# Patient Record
Sex: Male | Born: 1945 | Race: White | Hispanic: No | Marital: Married | Smoking: Never smoker
Health system: Southern US, Community
[De-identification: ages and names within clinical notes are randomized; demographics above are authoritative.]

## PROBLEM LIST (undated history)

## (undated) DIAGNOSIS — M1990 Unspecified osteoarthritis, unspecified site: Secondary | ICD-10-CM

## (undated) DIAGNOSIS — I4892 Unspecified atrial flutter: Secondary | ICD-10-CM

## (undated) DIAGNOSIS — R51 Headache: Secondary | ICD-10-CM

## (undated) DIAGNOSIS — T7840XA Allergy, unspecified, initial encounter: Secondary | ICD-10-CM

## (undated) DIAGNOSIS — Z45018 Encounter for adjustment and management of other part of cardiac pacemaker: Secondary | ICD-10-CM

## (undated) DIAGNOSIS — S065X9A Traumatic subdural hemorrhage with loss of consciousness of unspecified duration, initial encounter: Secondary | ICD-10-CM

## (undated) DIAGNOSIS — F419 Anxiety disorder, unspecified: Secondary | ICD-10-CM

## (undated) DIAGNOSIS — S3210XA Unspecified fracture of sacrum, initial encounter for closed fracture: Secondary | ICD-10-CM

## (undated) DIAGNOSIS — F411 Generalized anxiety disorder: Secondary | ICD-10-CM

## (undated) DIAGNOSIS — Z79899 Other long term (current) drug therapy: Secondary | ICD-10-CM

## (undated) DIAGNOSIS — R569 Unspecified convulsions: Secondary | ICD-10-CM

## (undated) DIAGNOSIS — R251 Tremor, unspecified: Secondary | ICD-10-CM

## (undated) DIAGNOSIS — S069X9A Unspecified intracranial injury with loss of consciousness of unspecified duration, initial encounter: Secondary | ICD-10-CM

## (undated) DIAGNOSIS — E785 Hyperlipidemia, unspecified: Secondary | ICD-10-CM

## (undated) DIAGNOSIS — H409 Unspecified glaucoma: Secondary | ICD-10-CM

## (undated) DIAGNOSIS — F329 Major depressive disorder, single episode, unspecified: Secondary | ICD-10-CM

## (undated) DIAGNOSIS — R4189 Other symptoms and signs involving cognitive functions and awareness: Secondary | ICD-10-CM

## (undated) DIAGNOSIS — S066X0A Traumatic subarachnoid hemorrhage without loss of consciousness, initial encounter: Secondary | ICD-10-CM

## (undated) DIAGNOSIS — I609 Nontraumatic subarachnoid hemorrhage, unspecified: Secondary | ICD-10-CM

## (undated) DIAGNOSIS — F339 Major depressive disorder, recurrent, unspecified: Secondary | ICD-10-CM

## (undated) DIAGNOSIS — H9190 Unspecified hearing loss, unspecified ear: Secondary | ICD-10-CM

## (undated) DIAGNOSIS — I480 Paroxysmal atrial fibrillation: Secondary | ICD-10-CM

## (undated) DIAGNOSIS — I495 Sick sinus syndrome: Secondary | ICD-10-CM

## (undated) DIAGNOSIS — F32A Depression, unspecified: Secondary | ICD-10-CM

## (undated) DIAGNOSIS — G40209 Localization-related (focal) (partial) symptomatic epilepsy and epileptic syndromes with complex partial seizures, not intractable, without status epilepticus: Secondary | ICD-10-CM

## (undated) DIAGNOSIS — Z950 Presence of cardiac pacemaker: Secondary | ICD-10-CM

## (undated) DIAGNOSIS — R2689 Other abnormalities of gait and mobility: Secondary | ICD-10-CM

## (undated) DIAGNOSIS — R001 Bradycardia, unspecified: Secondary | ICD-10-CM

## (undated) DIAGNOSIS — J45909 Unspecified asthma, uncomplicated: Secondary | ICD-10-CM

## (undated) DIAGNOSIS — S0291XA Unspecified fracture of skull, initial encounter for closed fracture: Secondary | ICD-10-CM

## (undated) DIAGNOSIS — R29898 Other symptoms and signs involving the musculoskeletal system: Secondary | ICD-10-CM

## (undated) DIAGNOSIS — T148XXA Other injury of unspecified body region, initial encounter: Secondary | ICD-10-CM

## (undated) HISTORY — DX: Unspecified osteoarthritis, unspecified site: M19.90

## (undated) HISTORY — DX: Unspecified asthma, uncomplicated: J45.909

## (undated) HISTORY — DX: Major depressive disorder, recurrent, unspecified: F33.9

## (undated) HISTORY — DX: Traumatic subdural hemorrhage with loss of consciousness of unspecified duration, initial encounter: S06.5X9A

## (undated) HISTORY — DX: Headache: R51

## (undated) HISTORY — DX: Allergy, unspecified, initial encounter: T78.40XA

## (undated) HISTORY — DX: Unspecified hearing loss, unspecified ear: H91.90

## (undated) HISTORY — DX: Traumatic subarachnoid hemorrhage without loss of consciousness, initial encounter: S06.6X0A

## (undated) HISTORY — DX: Anxiety disorder, unspecified: F41.9

## (undated) HISTORY — DX: Sick sinus syndrome: I49.5

## (undated) HISTORY — DX: Generalized anxiety disorder: F41.1

## (undated) HISTORY — DX: Localization-related (focal) (partial) symptomatic epilepsy and epileptic syndromes with complex partial seizures, not intractable, without status epilepticus: G40.209

## (undated) HISTORY — DX: Unspecified intracranial injury with loss of consciousness of unspecified duration, initial encounter: S06.9X9A

## (undated) HISTORY — DX: Other symptoms and signs involving cognitive functions and awareness: R41.89

## (undated) HISTORY — DX: Unspecified fracture of skull, initial encounter for closed fracture: S02.91XA

## (undated) HISTORY — DX: Nontraumatic subarachnoid hemorrhage, unspecified: I60.9

## (undated) HISTORY — DX: Other abnormalities of gait and mobility: R26.89

## (undated) HISTORY — DX: Other long term (current) drug therapy: Z79.899

## (undated) HISTORY — DX: Paroxysmal atrial fibrillation: I48.0

## (undated) HISTORY — DX: Tremor, unspecified: R25.1

## (undated) HISTORY — DX: Unspecified convulsions: R56.9

## (undated) HISTORY — DX: Other symptoms and signs involving the musculoskeletal system: R29.898

## (undated) HISTORY — DX: Other injury of unspecified body region, initial encounter: T14.8XXA

## (undated) HISTORY — DX: Unspecified glaucoma: H40.9

## (undated) HISTORY — DX: Presence of cardiac pacemaker: Z95.0

## (undated) HISTORY — DX: Bradycardia, unspecified: R00.1

## (undated) HISTORY — DX: Unspecified fracture of sacrum, initial encounter for closed fracture: S32.10XA

## (undated) HISTORY — DX: Unspecified atrial flutter: I48.92

## (undated) HISTORY — DX: Hyperlipidemia, unspecified: E78.5

## (undated) HISTORY — DX: Depression, unspecified: F32.A

## (undated) HISTORY — DX: Major depressive disorder, single episode, unspecified: F32.9

## (undated) HISTORY — DX: Encounter for adjustment and management of other part of cardiac pacemaker: Z45.018

---

## 1968-09-01 HISTORY — PX: LUMBAR LAMINECTOMY: SHX95

## 1996-09-01 HISTORY — PX: INGUINAL HERNIA REPAIR: SUR1180

## 2003-05-11 ENCOUNTER — Encounter: Admission: RE | Admit: 2003-05-11 | Discharge: 2003-05-11 | Payer: Self-pay | Admitting: Internal Medicine

## 2004-08-08 ENCOUNTER — Ambulatory Visit: Payer: Self-pay | Admitting: Internal Medicine

## 2004-08-20 ENCOUNTER — Ambulatory Visit: Payer: Self-pay | Admitting: Internal Medicine

## 2008-12-18 ENCOUNTER — Ambulatory Visit: Payer: Self-pay | Admitting: Internal Medicine

## 2009-01-11 ENCOUNTER — Ambulatory Visit: Payer: Self-pay | Admitting: Internal Medicine

## 2011-10-15 DIAGNOSIS — H2510 Age-related nuclear cataract, unspecified eye: Secondary | ICD-10-CM | POA: Diagnosis not present

## 2011-12-01 DIAGNOSIS — Z0000 Encounter for general adult medical examination without abnormal findings: Secondary | ICD-10-CM | POA: Diagnosis not present

## 2011-12-25 DIAGNOSIS — G479 Sleep disorder, unspecified: Secondary | ICD-10-CM | POA: Diagnosis not present

## 2011-12-25 DIAGNOSIS — M25569 Pain in unspecified knee: Secondary | ICD-10-CM | POA: Diagnosis not present

## 2011-12-29 DIAGNOSIS — E782 Mixed hyperlipidemia: Secondary | ICD-10-CM | POA: Diagnosis not present

## 2011-12-29 DIAGNOSIS — J3089 Other allergic rhinitis: Secondary | ICD-10-CM | POA: Diagnosis not present

## 2011-12-29 DIAGNOSIS — M159 Polyosteoarthritis, unspecified: Secondary | ICD-10-CM | POA: Diagnosis not present

## 2012-01-08 DIAGNOSIS — M999 Biomechanical lesion, unspecified: Secondary | ICD-10-CM | POA: Diagnosis not present

## 2012-01-12 DIAGNOSIS — M999 Biomechanical lesion, unspecified: Secondary | ICD-10-CM | POA: Diagnosis not present

## 2012-01-13 DIAGNOSIS — M999 Biomechanical lesion, unspecified: Secondary | ICD-10-CM | POA: Diagnosis not present

## 2012-01-14 DIAGNOSIS — M999 Biomechanical lesion, unspecified: Secondary | ICD-10-CM | POA: Diagnosis not present

## 2012-01-15 DIAGNOSIS — M999 Biomechanical lesion, unspecified: Secondary | ICD-10-CM | POA: Diagnosis not present

## 2012-01-19 DIAGNOSIS — M999 Biomechanical lesion, unspecified: Secondary | ICD-10-CM | POA: Diagnosis not present

## 2012-01-21 DIAGNOSIS — M999 Biomechanical lesion, unspecified: Secondary | ICD-10-CM | POA: Diagnosis not present

## 2012-03-01 DIAGNOSIS — H40019 Open angle with borderline findings, low risk, unspecified eye: Secondary | ICD-10-CM | POA: Diagnosis not present

## 2012-05-28 DIAGNOSIS — Z23 Encounter for immunization: Secondary | ICD-10-CM | POA: Diagnosis not present

## 2012-06-08 DIAGNOSIS — M999 Biomechanical lesion, unspecified: Secondary | ICD-10-CM | POA: Diagnosis not present

## 2012-06-09 DIAGNOSIS — M999 Biomechanical lesion, unspecified: Secondary | ICD-10-CM | POA: Diagnosis not present

## 2012-06-10 DIAGNOSIS — M999 Biomechanical lesion, unspecified: Secondary | ICD-10-CM | POA: Diagnosis not present

## 2012-06-28 DIAGNOSIS — M1710 Unilateral primary osteoarthritis, unspecified knee: Secondary | ICD-10-CM | POA: Diagnosis not present

## 2012-06-28 DIAGNOSIS — J3089 Other allergic rhinitis: Secondary | ICD-10-CM | POA: Diagnosis not present

## 2012-06-28 DIAGNOSIS — E782 Mixed hyperlipidemia: Secondary | ICD-10-CM | POA: Diagnosis not present

## 2012-08-10 DIAGNOSIS — H4011X0 Primary open-angle glaucoma, stage unspecified: Secondary | ICD-10-CM | POA: Diagnosis not present

## 2012-09-30 DIAGNOSIS — M999 Biomechanical lesion, unspecified: Secondary | ICD-10-CM | POA: Diagnosis not present

## 2012-10-04 DIAGNOSIS — M999 Biomechanical lesion, unspecified: Secondary | ICD-10-CM | POA: Diagnosis not present

## 2012-10-05 DIAGNOSIS — M999 Biomechanical lesion, unspecified: Secondary | ICD-10-CM | POA: Diagnosis not present

## 2012-10-06 DIAGNOSIS — M999 Biomechanical lesion, unspecified: Secondary | ICD-10-CM | POA: Diagnosis not present

## 2012-10-07 DIAGNOSIS — M999 Biomechanical lesion, unspecified: Secondary | ICD-10-CM | POA: Diagnosis not present

## 2012-10-11 DIAGNOSIS — M999 Biomechanical lesion, unspecified: Secondary | ICD-10-CM | POA: Diagnosis not present

## 2012-10-18 DIAGNOSIS — M999 Biomechanical lesion, unspecified: Secondary | ICD-10-CM | POA: Diagnosis not present

## 2012-11-06 DIAGNOSIS — IMO0002 Reserved for concepts with insufficient information to code with codable children: Secondary | ICD-10-CM | POA: Diagnosis not present

## 2012-11-06 DIAGNOSIS — S065XAA Traumatic subdural hemorrhage with loss of consciousness status unknown, initial encounter: Secondary | ICD-10-CM | POA: Diagnosis not present

## 2012-11-06 DIAGNOSIS — W010XXA Fall on same level from slipping, tripping and stumbling without subsequent striking against object, initial encounter: Secondary | ICD-10-CM | POA: Diagnosis not present

## 2012-11-06 DIAGNOSIS — R42 Dizziness and giddiness: Secondary | ICD-10-CM | POA: Diagnosis not present

## 2012-11-06 DIAGNOSIS — T07XXXA Unspecified multiple injuries, initial encounter: Secondary | ICD-10-CM | POA: Diagnosis not present

## 2012-11-06 DIAGNOSIS — S02109A Fracture of base of skull, unspecified side, initial encounter for closed fracture: Secondary | ICD-10-CM | POA: Diagnosis not present

## 2012-11-06 DIAGNOSIS — I4892 Unspecified atrial flutter: Secondary | ICD-10-CM | POA: Diagnosis not present

## 2012-11-06 DIAGNOSIS — I609 Nontraumatic subarachnoid hemorrhage, unspecified: Secondary | ICD-10-CM

## 2012-11-06 DIAGNOSIS — S065X9A Traumatic subdural hemorrhage with loss of consciousness of unspecified duration, initial encounter: Secondary | ICD-10-CM

## 2012-11-06 DIAGNOSIS — S0280XA Fracture of other specified skull and facial bones, unspecified side, initial encounter for closed fracture: Secondary | ICD-10-CM | POA: Diagnosis not present

## 2012-11-06 DIAGNOSIS — S066X0A Traumatic subarachnoid hemorrhage without loss of consciousness, initial encounter: Secondary | ICD-10-CM | POA: Diagnosis not present

## 2012-11-06 DIAGNOSIS — S79919A Unspecified injury of unspecified hip, initial encounter: Secondary | ICD-10-CM | POA: Diagnosis not present

## 2012-11-06 DIAGNOSIS — S3210XA Unspecified fracture of sacrum, initial encounter for closed fracture: Secondary | ICD-10-CM

## 2012-11-06 DIAGNOSIS — S066XAA Traumatic subarachnoid hemorrhage with loss of consciousness status unknown, initial encounter: Secondary | ICD-10-CM | POA: Diagnosis not present

## 2012-11-06 DIAGNOSIS — T148XXA Other injury of unspecified body region, initial encounter: Secondary | ICD-10-CM

## 2012-11-06 HISTORY — DX: Traumatic subdural hemorrhage with loss of consciousness of unspecified duration, initial encounter: S06.5X9A

## 2012-11-06 HISTORY — DX: Unspecified fracture of sacrum, initial encounter for closed fracture: S32.10XA

## 2012-11-06 HISTORY — DX: Other injury of unspecified body region, initial encounter: T14.8XXA

## 2012-11-06 HISTORY — DX: Traumatic subdural hemorrhage with loss of consciousness status unknown, initial encounter: S06.5XAA

## 2012-11-06 HISTORY — DX: Nontraumatic subarachnoid hemorrhage, unspecified: I60.9

## 2012-11-13 DIAGNOSIS — R51 Headache: Secondary | ICD-10-CM

## 2012-11-13 DIAGNOSIS — R4189 Other symptoms and signs involving cognitive functions and awareness: Secondary | ICD-10-CM

## 2012-11-13 DIAGNOSIS — R519 Headache, unspecified: Secondary | ICD-10-CM

## 2012-11-13 HISTORY — DX: Other symptoms and signs involving cognitive functions and awareness: R41.89

## 2012-11-13 HISTORY — DX: Headache, unspecified: R51.9

## 2012-11-17 DIAGNOSIS — S065XAA Traumatic subdural hemorrhage with loss of consciousness status unknown, initial encounter: Secondary | ICD-10-CM

## 2012-11-17 DIAGNOSIS — S065X9A Traumatic subdural hemorrhage with loss of consciousness of unspecified duration, initial encounter: Secondary | ICD-10-CM

## 2012-11-17 DIAGNOSIS — H906 Mixed conductive and sensorineural hearing loss, bilateral: Secondary | ICD-10-CM | POA: Diagnosis not present

## 2012-11-17 DIAGNOSIS — S3210XA Unspecified fracture of sacrum, initial encounter for closed fracture: Secondary | ICD-10-CM | POA: Diagnosis not present

## 2012-11-17 DIAGNOSIS — S322XXA Fracture of coccyx, initial encounter for closed fracture: Secondary | ICD-10-CM | POA: Diagnosis not present

## 2012-11-17 HISTORY — DX: Traumatic subdural hemorrhage with loss of consciousness of unspecified duration, initial encounter: S06.5X9A

## 2012-11-17 HISTORY — DX: Traumatic subdural hemorrhage with loss of consciousness status unknown, initial encounter: S06.5XAA

## 2012-12-14 DIAGNOSIS — Z5189 Encounter for other specified aftercare: Secondary | ICD-10-CM | POA: Diagnosis not present

## 2012-12-14 DIAGNOSIS — M6281 Muscle weakness (generalized): Secondary | ICD-10-CM | POA: Diagnosis not present

## 2012-12-14 DIAGNOSIS — I6200 Nontraumatic subdural hemorrhage, unspecified: Secondary | ICD-10-CM | POA: Diagnosis not present

## 2012-12-14 DIAGNOSIS — R41841 Cognitive communication deficit: Secondary | ICD-10-CM | POA: Diagnosis not present

## 2012-12-14 DIAGNOSIS — R269 Unspecified abnormalities of gait and mobility: Secondary | ICD-10-CM | POA: Diagnosis not present

## 2012-12-14 DIAGNOSIS — G44309 Post-traumatic headache, unspecified, not intractable: Secondary | ICD-10-CM | POA: Diagnosis not present

## 2012-12-15 DIAGNOSIS — G44309 Post-traumatic headache, unspecified, not intractable: Secondary | ICD-10-CM | POA: Diagnosis not present

## 2012-12-15 DIAGNOSIS — R269 Unspecified abnormalities of gait and mobility: Secondary | ICD-10-CM | POA: Diagnosis not present

## 2012-12-15 DIAGNOSIS — M6281 Muscle weakness (generalized): Secondary | ICD-10-CM | POA: Diagnosis not present

## 2012-12-15 DIAGNOSIS — Z5189 Encounter for other specified aftercare: Secondary | ICD-10-CM | POA: Diagnosis not present

## 2012-12-15 DIAGNOSIS — R41841 Cognitive communication deficit: Secondary | ICD-10-CM | POA: Diagnosis not present

## 2012-12-15 DIAGNOSIS — I6200 Nontraumatic subdural hemorrhage, unspecified: Secondary | ICD-10-CM | POA: Diagnosis not present

## 2012-12-20 DIAGNOSIS — N390 Urinary tract infection, site not specified: Secondary | ICD-10-CM | POA: Diagnosis not present

## 2012-12-20 DIAGNOSIS — I6200 Nontraumatic subdural hemorrhage, unspecified: Secondary | ICD-10-CM | POA: Diagnosis not present

## 2012-12-20 DIAGNOSIS — I10 Essential (primary) hypertension: Secondary | ICD-10-CM | POA: Diagnosis not present

## 2012-12-21 DIAGNOSIS — Z5189 Encounter for other specified aftercare: Secondary | ICD-10-CM | POA: Diagnosis not present

## 2012-12-21 DIAGNOSIS — R269 Unspecified abnormalities of gait and mobility: Secondary | ICD-10-CM | POA: Diagnosis not present

## 2012-12-21 DIAGNOSIS — I6200 Nontraumatic subdural hemorrhage, unspecified: Secondary | ICD-10-CM | POA: Diagnosis not present

## 2012-12-21 DIAGNOSIS — M6281 Muscle weakness (generalized): Secondary | ICD-10-CM | POA: Diagnosis not present

## 2012-12-21 DIAGNOSIS — G44309 Post-traumatic headache, unspecified, not intractable: Secondary | ICD-10-CM | POA: Diagnosis not present

## 2012-12-21 DIAGNOSIS — R41841 Cognitive communication deficit: Secondary | ICD-10-CM | POA: Diagnosis not present

## 2012-12-23 DIAGNOSIS — G44309 Post-traumatic headache, unspecified, not intractable: Secondary | ICD-10-CM | POA: Diagnosis not present

## 2012-12-23 DIAGNOSIS — Z5189 Encounter for other specified aftercare: Secondary | ICD-10-CM | POA: Diagnosis not present

## 2012-12-23 DIAGNOSIS — R269 Unspecified abnormalities of gait and mobility: Secondary | ICD-10-CM | POA: Diagnosis not present

## 2012-12-23 DIAGNOSIS — I6200 Nontraumatic subdural hemorrhage, unspecified: Secondary | ICD-10-CM | POA: Diagnosis not present

## 2012-12-23 DIAGNOSIS — M6281 Muscle weakness (generalized): Secondary | ICD-10-CM | POA: Diagnosis not present

## 2012-12-23 DIAGNOSIS — R41841 Cognitive communication deficit: Secondary | ICD-10-CM | POA: Diagnosis not present

## 2012-12-28 DIAGNOSIS — Z5189 Encounter for other specified aftercare: Secondary | ICD-10-CM | POA: Diagnosis not present

## 2012-12-28 DIAGNOSIS — R269 Unspecified abnormalities of gait and mobility: Secondary | ICD-10-CM | POA: Diagnosis not present

## 2012-12-28 DIAGNOSIS — M6281 Muscle weakness (generalized): Secondary | ICD-10-CM | POA: Diagnosis not present

## 2012-12-28 DIAGNOSIS — I6200 Nontraumatic subdural hemorrhage, unspecified: Secondary | ICD-10-CM | POA: Diagnosis not present

## 2012-12-28 DIAGNOSIS — R41841 Cognitive communication deficit: Secondary | ICD-10-CM | POA: Diagnosis not present

## 2012-12-28 DIAGNOSIS — G44309 Post-traumatic headache, unspecified, not intractable: Secondary | ICD-10-CM | POA: Diagnosis not present

## 2012-12-30 DIAGNOSIS — R41841 Cognitive communication deficit: Secondary | ICD-10-CM | POA: Diagnosis not present

## 2012-12-30 DIAGNOSIS — M6281 Muscle weakness (generalized): Secondary | ICD-10-CM | POA: Diagnosis not present

## 2012-12-30 DIAGNOSIS — Z5189 Encounter for other specified aftercare: Secondary | ICD-10-CM | POA: Diagnosis not present

## 2012-12-30 DIAGNOSIS — I6200 Nontraumatic subdural hemorrhage, unspecified: Secondary | ICD-10-CM | POA: Diagnosis not present

## 2012-12-30 DIAGNOSIS — R269 Unspecified abnormalities of gait and mobility: Secondary | ICD-10-CM | POA: Diagnosis not present

## 2012-12-30 DIAGNOSIS — G44309 Post-traumatic headache, unspecified, not intractable: Secondary | ICD-10-CM | POA: Diagnosis not present

## 2013-01-03 DIAGNOSIS — G44309 Post-traumatic headache, unspecified, not intractable: Secondary | ICD-10-CM | POA: Diagnosis not present

## 2013-01-03 DIAGNOSIS — I6200 Nontraumatic subdural hemorrhage, unspecified: Secondary | ICD-10-CM | POA: Diagnosis not present

## 2013-01-03 DIAGNOSIS — M6281 Muscle weakness (generalized): Secondary | ICD-10-CM | POA: Diagnosis not present

## 2013-01-03 DIAGNOSIS — Z5189 Encounter for other specified aftercare: Secondary | ICD-10-CM | POA: Diagnosis not present

## 2013-01-03 DIAGNOSIS — R41841 Cognitive communication deficit: Secondary | ICD-10-CM | POA: Diagnosis not present

## 2013-01-03 DIAGNOSIS — R269 Unspecified abnormalities of gait and mobility: Secondary | ICD-10-CM | POA: Diagnosis not present

## 2013-01-04 DIAGNOSIS — I499 Cardiac arrhythmia, unspecified: Secondary | ICD-10-CM | POA: Diagnosis not present

## 2013-01-04 DIAGNOSIS — I4892 Unspecified atrial flutter: Secondary | ICD-10-CM | POA: Diagnosis not present

## 2013-01-04 DIAGNOSIS — I491 Atrial premature depolarization: Secondary | ICD-10-CM | POA: Diagnosis not present

## 2013-01-06 DIAGNOSIS — G44309 Post-traumatic headache, unspecified, not intractable: Secondary | ICD-10-CM | POA: Diagnosis not present

## 2013-01-06 DIAGNOSIS — R269 Unspecified abnormalities of gait and mobility: Secondary | ICD-10-CM | POA: Diagnosis not present

## 2013-01-06 DIAGNOSIS — Z5189 Encounter for other specified aftercare: Secondary | ICD-10-CM | POA: Diagnosis not present

## 2013-01-06 DIAGNOSIS — M6281 Muscle weakness (generalized): Secondary | ICD-10-CM | POA: Diagnosis not present

## 2013-01-06 DIAGNOSIS — I6200 Nontraumatic subdural hemorrhage, unspecified: Secondary | ICD-10-CM | POA: Diagnosis not present

## 2013-01-06 DIAGNOSIS — R41841 Cognitive communication deficit: Secondary | ICD-10-CM | POA: Diagnosis not present

## 2013-01-11 DIAGNOSIS — R269 Unspecified abnormalities of gait and mobility: Secondary | ICD-10-CM | POA: Diagnosis not present

## 2013-01-11 DIAGNOSIS — G44309 Post-traumatic headache, unspecified, not intractable: Secondary | ICD-10-CM | POA: Diagnosis not present

## 2013-01-11 DIAGNOSIS — I6200 Nontraumatic subdural hemorrhage, unspecified: Secondary | ICD-10-CM | POA: Diagnosis not present

## 2013-01-11 DIAGNOSIS — M6281 Muscle weakness (generalized): Secondary | ICD-10-CM | POA: Diagnosis not present

## 2013-01-11 DIAGNOSIS — Z5189 Encounter for other specified aftercare: Secondary | ICD-10-CM | POA: Diagnosis not present

## 2013-01-11 DIAGNOSIS — R41841 Cognitive communication deficit: Secondary | ICD-10-CM | POA: Diagnosis not present

## 2013-01-13 DIAGNOSIS — R41841 Cognitive communication deficit: Secondary | ICD-10-CM | POA: Diagnosis not present

## 2013-01-13 DIAGNOSIS — Z5189 Encounter for other specified aftercare: Secondary | ICD-10-CM | POA: Diagnosis not present

## 2013-01-13 DIAGNOSIS — I6200 Nontraumatic subdural hemorrhage, unspecified: Secondary | ICD-10-CM | POA: Diagnosis not present

## 2013-01-13 DIAGNOSIS — G44309 Post-traumatic headache, unspecified, not intractable: Secondary | ICD-10-CM | POA: Diagnosis not present

## 2013-01-13 DIAGNOSIS — R269 Unspecified abnormalities of gait and mobility: Secondary | ICD-10-CM | POA: Diagnosis not present

## 2013-01-13 DIAGNOSIS — M6281 Muscle weakness (generalized): Secondary | ICD-10-CM | POA: Diagnosis not present

## 2013-01-17 DIAGNOSIS — H903 Sensorineural hearing loss, bilateral: Secondary | ICD-10-CM | POA: Diagnosis not present

## 2013-01-17 DIAGNOSIS — H9190 Unspecified hearing loss, unspecified ear: Secondary | ICD-10-CM | POA: Diagnosis not present

## 2013-01-17 DIAGNOSIS — H9319 Tinnitus, unspecified ear: Secondary | ICD-10-CM | POA: Diagnosis not present

## 2013-01-18 DIAGNOSIS — I6200 Nontraumatic subdural hemorrhage, unspecified: Secondary | ICD-10-CM | POA: Diagnosis not present

## 2013-01-18 DIAGNOSIS — R41841 Cognitive communication deficit: Secondary | ICD-10-CM | POA: Diagnosis not present

## 2013-01-18 DIAGNOSIS — Z5189 Encounter for other specified aftercare: Secondary | ICD-10-CM | POA: Diagnosis not present

## 2013-01-18 DIAGNOSIS — R269 Unspecified abnormalities of gait and mobility: Secondary | ICD-10-CM | POA: Diagnosis not present

## 2013-01-18 DIAGNOSIS — G44309 Post-traumatic headache, unspecified, not intractable: Secondary | ICD-10-CM | POA: Diagnosis not present

## 2013-01-18 DIAGNOSIS — M6281 Muscle weakness (generalized): Secondary | ICD-10-CM | POA: Diagnosis not present

## 2013-01-20 DIAGNOSIS — R41841 Cognitive communication deficit: Secondary | ICD-10-CM | POA: Diagnosis not present

## 2013-01-20 DIAGNOSIS — Z5189 Encounter for other specified aftercare: Secondary | ICD-10-CM | POA: Diagnosis not present

## 2013-01-20 DIAGNOSIS — M6281 Muscle weakness (generalized): Secondary | ICD-10-CM | POA: Diagnosis not present

## 2013-01-20 DIAGNOSIS — I6200 Nontraumatic subdural hemorrhage, unspecified: Secondary | ICD-10-CM | POA: Diagnosis not present

## 2013-01-20 DIAGNOSIS — R269 Unspecified abnormalities of gait and mobility: Secondary | ICD-10-CM | POA: Diagnosis not present

## 2013-01-20 DIAGNOSIS — G44309 Post-traumatic headache, unspecified, not intractable: Secondary | ICD-10-CM | POA: Diagnosis not present

## 2013-01-22 DIAGNOSIS — H9190 Unspecified hearing loss, unspecified ear: Secondary | ICD-10-CM | POA: Diagnosis not present

## 2013-02-01 DIAGNOSIS — H4011X0 Primary open-angle glaucoma, stage unspecified: Secondary | ICD-10-CM | POA: Diagnosis not present

## 2013-02-28 DIAGNOSIS — S066X0A Traumatic subarachnoid hemorrhage without loss of consciousness, initial encounter: Secondary | ICD-10-CM

## 2013-02-28 DIAGNOSIS — S0291XA Unspecified fracture of skull, initial encounter for closed fracture: Secondary | ICD-10-CM

## 2013-02-28 HISTORY — DX: Unspecified fracture of skull, initial encounter for closed fracture: S02.91XA

## 2013-02-28 HISTORY — DX: Traumatic subarachnoid hemorrhage without loss of consciousness, initial encounter: S06.6X0A

## 2013-03-01 DIAGNOSIS — S02109A Fracture of base of skull, unspecified side, initial encounter for closed fracture: Secondary | ICD-10-CM | POA: Diagnosis not present

## 2013-03-01 DIAGNOSIS — I6200 Nontraumatic subdural hemorrhage, unspecified: Secondary | ICD-10-CM | POA: Diagnosis not present

## 2013-03-01 DIAGNOSIS — IMO0001 Reserved for inherently not codable concepts without codable children: Secondary | ICD-10-CM | POA: Diagnosis not present

## 2013-03-02 DIAGNOSIS — Z1331 Encounter for screening for depression: Secondary | ICD-10-CM | POA: Diagnosis not present

## 2013-03-02 DIAGNOSIS — R0602 Shortness of breath: Secondary | ICD-10-CM | POA: Diagnosis not present

## 2013-03-02 DIAGNOSIS — F329 Major depressive disorder, single episode, unspecified: Secondary | ICD-10-CM | POA: Diagnosis not present

## 2013-04-05 DIAGNOSIS — I10 Essential (primary) hypertension: Secondary | ICD-10-CM | POA: Diagnosis not present

## 2013-04-05 DIAGNOSIS — E782 Mixed hyperlipidemia: Secondary | ICD-10-CM | POA: Diagnosis not present

## 2013-04-05 DIAGNOSIS — F329 Major depressive disorder, single episode, unspecified: Secondary | ICD-10-CM | POA: Diagnosis not present

## 2013-04-05 DIAGNOSIS — R0602 Shortness of breath: Secondary | ICD-10-CM | POA: Diagnosis not present

## 2013-04-18 DIAGNOSIS — R0602 Shortness of breath: Secondary | ICD-10-CM | POA: Diagnosis not present

## 2013-04-27 DIAGNOSIS — IMO0002 Reserved for concepts with insufficient information to code with codable children: Secondary | ICD-10-CM | POA: Diagnosis not present

## 2013-04-27 DIAGNOSIS — R0602 Shortness of breath: Secondary | ICD-10-CM | POA: Diagnosis not present

## 2013-04-27 DIAGNOSIS — Z1389 Encounter for screening for other disorder: Secondary | ICD-10-CM | POA: Diagnosis not present

## 2013-04-27 DIAGNOSIS — I4892 Unspecified atrial flutter: Secondary | ICD-10-CM | POA: Diagnosis not present

## 2013-05-04 DIAGNOSIS — R0602 Shortness of breath: Secondary | ICD-10-CM | POA: Diagnosis not present

## 2013-05-04 DIAGNOSIS — I4891 Unspecified atrial fibrillation: Secondary | ICD-10-CM | POA: Diagnosis not present

## 2013-05-04 DIAGNOSIS — E785 Hyperlipidemia, unspecified: Secondary | ICD-10-CM | POA: Diagnosis not present

## 2013-05-04 DIAGNOSIS — R259 Unspecified abnormal involuntary movements: Secondary | ICD-10-CM | POA: Diagnosis not present

## 2013-05-04 DIAGNOSIS — I4892 Unspecified atrial flutter: Secondary | ICD-10-CM | POA: Diagnosis not present

## 2013-05-04 DIAGNOSIS — Z8679 Personal history of other diseases of the circulatory system: Secondary | ICD-10-CM | POA: Diagnosis not present

## 2013-05-04 DIAGNOSIS — Z8249 Family history of ischemic heart disease and other diseases of the circulatory system: Secondary | ICD-10-CM | POA: Diagnosis not present

## 2013-05-04 DIAGNOSIS — Z789 Other specified health status: Secondary | ICD-10-CM | POA: Diagnosis not present

## 2013-05-09 DIAGNOSIS — I4891 Unspecified atrial fibrillation: Secondary | ICD-10-CM | POA: Diagnosis not present

## 2013-05-09 DIAGNOSIS — R0602 Shortness of breath: Secondary | ICD-10-CM | POA: Diagnosis not present

## 2013-05-11 DIAGNOSIS — I4892 Unspecified atrial flutter: Secondary | ICD-10-CM | POA: Diagnosis not present

## 2013-05-11 DIAGNOSIS — R0602 Shortness of breath: Secondary | ICD-10-CM | POA: Diagnosis not present

## 2013-05-11 DIAGNOSIS — Z23 Encounter for immunization: Secondary | ICD-10-CM | POA: Diagnosis not present

## 2013-05-14 DIAGNOSIS — I4891 Unspecified atrial fibrillation: Secondary | ICD-10-CM | POA: Diagnosis not present

## 2013-05-27 DIAGNOSIS — I4892 Unspecified atrial flutter: Secondary | ICD-10-CM | POA: Diagnosis not present

## 2013-06-01 DIAGNOSIS — Z9181 History of falling: Secondary | ICD-10-CM | POA: Diagnosis not present

## 2013-06-01 DIAGNOSIS — R29818 Other symptoms and signs involving the nervous system: Secondary | ICD-10-CM | POA: Diagnosis not present

## 2013-06-01 DIAGNOSIS — G9389 Other specified disorders of brain: Secondary | ICD-10-CM | POA: Diagnosis not present

## 2013-06-01 DIAGNOSIS — R2689 Other abnormalities of gait and mobility: Secondary | ICD-10-CM

## 2013-06-01 DIAGNOSIS — Z87820 Personal history of traumatic brain injury: Secondary | ICD-10-CM | POA: Diagnosis not present

## 2013-06-01 DIAGNOSIS — S0990XA Unspecified injury of head, initial encounter: Secondary | ICD-10-CM | POA: Diagnosis not present

## 2013-06-01 DIAGNOSIS — IMO0001 Reserved for inherently not codable concepts without codable children: Secondary | ICD-10-CM | POA: Diagnosis not present

## 2013-06-01 DIAGNOSIS — R413 Other amnesia: Secondary | ICD-10-CM | POA: Diagnosis not present

## 2013-06-01 DIAGNOSIS — R29898 Other symptoms and signs involving the musculoskeletal system: Secondary | ICD-10-CM

## 2013-06-01 HISTORY — DX: Other symptoms and signs involving the musculoskeletal system: R29.898

## 2013-06-01 HISTORY — DX: Other abnormalities of gait and mobility: R26.89

## 2013-06-28 DIAGNOSIS — I4891 Unspecified atrial fibrillation: Secondary | ICD-10-CM | POA: Diagnosis not present

## 2013-06-28 DIAGNOSIS — I4892 Unspecified atrial flutter: Secondary | ICD-10-CM | POA: Diagnosis not present

## 2013-06-28 DIAGNOSIS — Z79899 Other long term (current) drug therapy: Secondary | ICD-10-CM | POA: Diagnosis not present

## 2013-06-29 DIAGNOSIS — S069X0A Unspecified intracranial injury without loss of consciousness, initial encounter: Secondary | ICD-10-CM | POA: Diagnosis not present

## 2013-06-29 DIAGNOSIS — F329 Major depressive disorder, single episode, unspecified: Secondary | ICD-10-CM | POA: Diagnosis not present

## 2013-06-29 DIAGNOSIS — Z87820 Personal history of traumatic brain injury: Secondary | ICD-10-CM | POA: Diagnosis not present

## 2013-07-06 DIAGNOSIS — G3184 Mild cognitive impairment, so stated: Secondary | ICD-10-CM | POA: Diagnosis not present

## 2013-07-06 DIAGNOSIS — R29898 Other symptoms and signs involving the musculoskeletal system: Secondary | ICD-10-CM | POA: Diagnosis not present

## 2013-07-06 DIAGNOSIS — M4802 Spinal stenosis, cervical region: Secondary | ICD-10-CM | POA: Diagnosis not present

## 2013-07-06 DIAGNOSIS — M5030 Other cervical disc degeneration, unspecified cervical region: Secondary | ICD-10-CM | POA: Diagnosis not present

## 2013-07-06 DIAGNOSIS — Z87820 Personal history of traumatic brain injury: Secondary | ICD-10-CM | POA: Diagnosis not present

## 2013-07-06 DIAGNOSIS — R29818 Other symptoms and signs involving the nervous system: Secondary | ICD-10-CM | POA: Diagnosis not present

## 2013-07-06 DIAGNOSIS — M48061 Spinal stenosis, lumbar region without neurogenic claudication: Secondary | ICD-10-CM | POA: Diagnosis not present

## 2013-07-25 DIAGNOSIS — M999 Biomechanical lesion, unspecified: Secondary | ICD-10-CM | POA: Diagnosis not present

## 2013-07-26 DIAGNOSIS — M999 Biomechanical lesion, unspecified: Secondary | ICD-10-CM | POA: Diagnosis not present

## 2013-07-27 DIAGNOSIS — M999 Biomechanical lesion, unspecified: Secondary | ICD-10-CM | POA: Diagnosis not present

## 2013-08-01 DIAGNOSIS — M999 Biomechanical lesion, unspecified: Secondary | ICD-10-CM | POA: Diagnosis not present

## 2013-08-02 DIAGNOSIS — H4011X0 Primary open-angle glaucoma, stage unspecified: Secondary | ICD-10-CM | POA: Diagnosis not present

## 2013-08-02 DIAGNOSIS — H2510 Age-related nuclear cataract, unspecified eye: Secondary | ICD-10-CM | POA: Diagnosis not present

## 2013-08-05 DIAGNOSIS — E782 Mixed hyperlipidemia: Secondary | ICD-10-CM | POA: Diagnosis not present

## 2013-08-05 DIAGNOSIS — Z125 Encounter for screening for malignant neoplasm of prostate: Secondary | ICD-10-CM | POA: Diagnosis not present

## 2013-08-05 DIAGNOSIS — M4120 Other idiopathic scoliosis, site unspecified: Secondary | ICD-10-CM | POA: Diagnosis not present

## 2013-08-05 DIAGNOSIS — G8929 Other chronic pain: Secondary | ICD-10-CM | POA: Diagnosis not present

## 2013-08-05 DIAGNOSIS — Z23 Encounter for immunization: Secondary | ICD-10-CM | POA: Diagnosis not present

## 2013-08-05 DIAGNOSIS — M47817 Spondylosis without myelopathy or radiculopathy, lumbosacral region: Secondary | ICD-10-CM | POA: Diagnosis not present

## 2013-08-05 DIAGNOSIS — I10 Essential (primary) hypertension: Secondary | ICD-10-CM | POA: Diagnosis not present

## 2013-08-05 DIAGNOSIS — M545 Low back pain: Secondary | ICD-10-CM | POA: Diagnosis not present

## 2013-08-08 DIAGNOSIS — M545 Low back pain: Secondary | ICD-10-CM | POA: Diagnosis not present

## 2013-08-10 DIAGNOSIS — M545 Low back pain: Secondary | ICD-10-CM | POA: Diagnosis not present

## 2013-08-11 DIAGNOSIS — M545 Low back pain: Secondary | ICD-10-CM | POA: Diagnosis not present

## 2013-08-15 DIAGNOSIS — M545 Low back pain: Secondary | ICD-10-CM | POA: Diagnosis not present

## 2013-08-17 DIAGNOSIS — M545 Low back pain: Secondary | ICD-10-CM | POA: Diagnosis not present

## 2013-08-19 DIAGNOSIS — M545 Low back pain: Secondary | ICD-10-CM | POA: Diagnosis not present

## 2013-08-22 DIAGNOSIS — M545 Low back pain: Secondary | ICD-10-CM | POA: Diagnosis not present

## 2013-08-23 DIAGNOSIS — N401 Enlarged prostate with lower urinary tract symptoms: Secondary | ICD-10-CM | POA: Diagnosis not present

## 2013-08-23 DIAGNOSIS — M545 Low back pain: Secondary | ICD-10-CM | POA: Diagnosis not present

## 2013-08-23 DIAGNOSIS — R972 Elevated prostate specific antigen [PSA]: Secondary | ICD-10-CM | POA: Diagnosis not present

## 2013-08-31 DIAGNOSIS — M545 Low back pain: Secondary | ICD-10-CM | POA: Diagnosis not present

## 2013-09-02 DIAGNOSIS — M545 Low back pain, unspecified: Secondary | ICD-10-CM | POA: Diagnosis not present

## 2013-09-05 DIAGNOSIS — M545 Low back pain, unspecified: Secondary | ICD-10-CM | POA: Diagnosis not present

## 2013-09-07 DIAGNOSIS — M545 Low back pain, unspecified: Secondary | ICD-10-CM | POA: Diagnosis not present

## 2013-09-07 DIAGNOSIS — N401 Enlarged prostate with lower urinary tract symptoms: Secondary | ICD-10-CM | POA: Diagnosis not present

## 2013-09-07 DIAGNOSIS — R972 Elevated prostate specific antigen [PSA]: Secondary | ICD-10-CM | POA: Diagnosis not present

## 2013-09-07 DIAGNOSIS — N138 Other obstructive and reflux uropathy: Secondary | ICD-10-CM | POA: Diagnosis not present

## 2013-09-08 DIAGNOSIS — M545 Low back pain, unspecified: Secondary | ICD-10-CM | POA: Diagnosis not present

## 2013-09-09 DIAGNOSIS — Z9889 Other specified postprocedural states: Secondary | ICD-10-CM | POA: Diagnosis not present

## 2013-09-09 DIAGNOSIS — Z7982 Long term (current) use of aspirin: Secondary | ICD-10-CM | POA: Diagnosis not present

## 2013-09-09 DIAGNOSIS — S0291XS Unspecified fracture of skull, sequela: Secondary | ICD-10-CM | POA: Diagnosis not present

## 2013-09-09 DIAGNOSIS — Z8719 Personal history of other diseases of the digestive system: Secondary | ICD-10-CM | POA: Diagnosis not present

## 2013-09-09 DIAGNOSIS — Z862 Personal history of diseases of the blood and blood-forming organs and certain disorders involving the immune mechanism: Secondary | ICD-10-CM | POA: Diagnosis not present

## 2013-09-09 DIAGNOSIS — S0292XS Unspecified fracture of facial bones, sequela: Secondary | ICD-10-CM | POA: Diagnosis not present

## 2013-09-09 DIAGNOSIS — M545 Low back pain, unspecified: Secondary | ICD-10-CM | POA: Diagnosis not present

## 2013-09-09 DIAGNOSIS — Z87820 Personal history of traumatic brain injury: Secondary | ICD-10-CM | POA: Diagnosis not present

## 2013-09-09 DIAGNOSIS — Z8709 Personal history of other diseases of the respiratory system: Secondary | ICD-10-CM | POA: Diagnosis not present

## 2013-09-12 DIAGNOSIS — M545 Low back pain, unspecified: Secondary | ICD-10-CM | POA: Diagnosis not present

## 2013-09-14 DIAGNOSIS — M545 Low back pain, unspecified: Secondary | ICD-10-CM | POA: Diagnosis not present

## 2013-09-16 DIAGNOSIS — M545 Low back pain, unspecified: Secondary | ICD-10-CM | POA: Diagnosis not present

## 2013-09-19 DIAGNOSIS — M545 Low back pain, unspecified: Secondary | ICD-10-CM | POA: Diagnosis not present

## 2013-09-23 DIAGNOSIS — M545 Low back pain, unspecified: Secondary | ICD-10-CM | POA: Diagnosis not present

## 2013-09-26 DIAGNOSIS — M545 Low back pain, unspecified: Secondary | ICD-10-CM | POA: Diagnosis not present

## 2013-09-30 DIAGNOSIS — M545 Low back pain, unspecified: Secondary | ICD-10-CM | POA: Diagnosis not present

## 2013-10-05 DIAGNOSIS — S069X9A Unspecified intracranial injury with loss of consciousness of unspecified duration, initial encounter: Secondary | ICD-10-CM

## 2013-10-05 DIAGNOSIS — S069XAA Unspecified intracranial injury with loss of consciousness status unknown, initial encounter: Secondary | ICD-10-CM | POA: Insufficient documentation

## 2013-10-05 DIAGNOSIS — F339 Major depressive disorder, recurrent, unspecified: Secondary | ICD-10-CM

## 2013-10-05 DIAGNOSIS — S069X0A Unspecified intracranial injury without loss of consciousness, initial encounter: Secondary | ICD-10-CM | POA: Diagnosis not present

## 2013-10-05 HISTORY — DX: Unspecified intracranial injury with loss of consciousness of unspecified duration, initial encounter: S06.9X9A

## 2013-10-05 HISTORY — DX: Major depressive disorder, recurrent, unspecified: F33.9

## 2013-10-05 HISTORY — DX: Unspecified intracranial injury with loss of consciousness status unknown, initial encounter: S06.9XAA

## 2013-10-07 DIAGNOSIS — M545 Low back pain, unspecified: Secondary | ICD-10-CM | POA: Diagnosis not present

## 2013-10-10 DIAGNOSIS — Z79899 Other long term (current) drug therapy: Secondary | ICD-10-CM | POA: Diagnosis not present

## 2013-10-10 DIAGNOSIS — I4892 Unspecified atrial flutter: Secondary | ICD-10-CM | POA: Diagnosis not present

## 2013-10-10 DIAGNOSIS — R918 Other nonspecific abnormal finding of lung field: Secondary | ICD-10-CM | POA: Diagnosis not present

## 2013-10-10 DIAGNOSIS — Z8679 Personal history of other diseases of the circulatory system: Secondary | ICD-10-CM | POA: Diagnosis not present

## 2013-10-10 DIAGNOSIS — E785 Hyperlipidemia, unspecified: Secondary | ICD-10-CM | POA: Diagnosis not present

## 2013-10-14 DIAGNOSIS — M545 Low back pain, unspecified: Secondary | ICD-10-CM | POA: Diagnosis not present

## 2013-11-02 DIAGNOSIS — S069X0A Unspecified intracranial injury without loss of consciousness, initial encounter: Secondary | ICD-10-CM | POA: Diagnosis not present

## 2013-12-07 DIAGNOSIS — F331 Major depressive disorder, recurrent, moderate: Secondary | ICD-10-CM

## 2013-12-07 DIAGNOSIS — F411 Generalized anxiety disorder: Secondary | ICD-10-CM

## 2013-12-08 ENCOUNTER — Encounter: Payer: Self-pay | Admitting: Internal Medicine

## 2013-12-29 ENCOUNTER — Ambulatory Visit: Payer: Self-pay | Admitting: Physical Therapy

## 2013-12-29 DIAGNOSIS — F3289 Other specified depressive episodes: Secondary | ICD-10-CM | POA: Diagnosis not present

## 2013-12-29 DIAGNOSIS — F411 Generalized anxiety disorder: Secondary | ICD-10-CM | POA: Diagnosis not present

## 2013-12-29 DIAGNOSIS — F329 Major depressive disorder, single episode, unspecified: Secondary | ICD-10-CM | POA: Diagnosis not present

## 2014-01-04 ENCOUNTER — Encounter: Payer: Self-pay | Admitting: Internal Medicine

## 2014-01-04 DIAGNOSIS — E782 Mixed hyperlipidemia: Secondary | ICD-10-CM | POA: Diagnosis not present

## 2014-01-04 DIAGNOSIS — M549 Dorsalgia, unspecified: Secondary | ICD-10-CM | POA: Diagnosis not present

## 2014-01-04 DIAGNOSIS — G8929 Other chronic pain: Secondary | ICD-10-CM | POA: Diagnosis not present

## 2014-01-04 DIAGNOSIS — I10 Essential (primary) hypertension: Secondary | ICD-10-CM | POA: Diagnosis not present

## 2014-01-05 DIAGNOSIS — N138 Other obstructive and reflux uropathy: Secondary | ICD-10-CM | POA: Diagnosis not present

## 2014-01-05 DIAGNOSIS — R972 Elevated prostate specific antigen [PSA]: Secondary | ICD-10-CM | POA: Diagnosis not present

## 2014-01-05 DIAGNOSIS — N401 Enlarged prostate with lower urinary tract symptoms: Secondary | ICD-10-CM | POA: Diagnosis not present

## 2014-01-11 DIAGNOSIS — F411 Generalized anxiety disorder: Secondary | ICD-10-CM | POA: Diagnosis not present

## 2014-01-11 DIAGNOSIS — F330 Major depressive disorder, recurrent, mild: Secondary | ICD-10-CM | POA: Diagnosis not present

## 2014-01-12 DIAGNOSIS — F411 Generalized anxiety disorder: Secondary | ICD-10-CM

## 2014-01-12 HISTORY — DX: Generalized anxiety disorder: F41.1

## 2014-01-13 DIAGNOSIS — F329 Major depressive disorder, single episode, unspecified: Secondary | ICD-10-CM | POA: Diagnosis not present

## 2014-01-13 DIAGNOSIS — F3289 Other specified depressive episodes: Secondary | ICD-10-CM | POA: Diagnosis not present

## 2014-01-13 DIAGNOSIS — F411 Generalized anxiety disorder: Secondary | ICD-10-CM

## 2014-02-02 DIAGNOSIS — H4011X0 Primary open-angle glaucoma, stage unspecified: Secondary | ICD-10-CM | POA: Diagnosis not present

## 2014-03-01 DIAGNOSIS — Z79899 Other long term (current) drug therapy: Secondary | ICD-10-CM | POA: Diagnosis not present

## 2014-03-01 DIAGNOSIS — I4892 Unspecified atrial flutter: Secondary | ICD-10-CM | POA: Diagnosis not present

## 2014-03-01 DIAGNOSIS — I4891 Unspecified atrial fibrillation: Secondary | ICD-10-CM | POA: Diagnosis not present

## 2014-03-13 DIAGNOSIS — I4892 Unspecified atrial flutter: Secondary | ICD-10-CM | POA: Diagnosis not present

## 2014-03-13 DIAGNOSIS — I4891 Unspecified atrial fibrillation: Secondary | ICD-10-CM | POA: Diagnosis not present

## 2014-03-15 ENCOUNTER — Ambulatory Visit (AMBULATORY_SURGERY_CENTER): Payer: Self-pay | Admitting: *Deleted

## 2014-03-15 VITALS — Ht 75.0 in | Wt 195.8 lb

## 2014-03-15 DIAGNOSIS — Z86010 Personal history of colonic polyps: Secondary | ICD-10-CM

## 2014-03-15 MED ORDER — MOVIPREP 100 G PO SOLR: ORAL | Status: DC

## 2014-03-15 NOTE — Progress Notes (Signed)
No allergies to eggs or soy. No problems with anesthesia.  Pt given Emmi instructions for colonoscopy  No oxygen use  No diet drug use  

## 2014-03-17 DIAGNOSIS — I4891 Unspecified atrial fibrillation: Secondary | ICD-10-CM | POA: Diagnosis not present

## 2014-03-29 ENCOUNTER — Ambulatory Visit (AMBULATORY_SURGERY_CENTER): Payer: Medicare Other | Admitting: Internal Medicine

## 2014-03-29 ENCOUNTER — Encounter: Payer: Self-pay | Admitting: Internal Medicine

## 2014-03-29 VITALS — BP 130/62 | HR 52 | Temp 96.6°F | Resp 19 | Ht 75.0 in | Wt 195.0 lb

## 2014-03-29 DIAGNOSIS — Z86010 Personal history of colonic polyps: Secondary | ICD-10-CM

## 2014-03-29 DIAGNOSIS — J45909 Unspecified asthma, uncomplicated: Secondary | ICD-10-CM | POA: Diagnosis not present

## 2014-03-29 DIAGNOSIS — Z800 Family history of malignant neoplasm of digestive organs: Secondary | ICD-10-CM

## 2014-03-29 DIAGNOSIS — Z1211 Encounter for screening for malignant neoplasm of colon: Secondary | ICD-10-CM | POA: Diagnosis not present

## 2014-03-29 MED ORDER — SODIUM CHLORIDE 0.9 % IV SOLN: 500.0000 mL | INTRAVENOUS | Status: DC

## 2014-03-29 NOTE — Op Note (Signed)
[facility]  [address] [place], [id number]   COLONOSCOPY PROCEDURE REPORT  PATIENT: Lawrence Braun, Lawrence Braun  MR#: [id number] BIRTHDATE: 28-Oct-1945 , 2  yrs. old GENDER: Male ENDOSCOPIST: Eustace Quail, MD REFERRED KC:[id number] Program Recall PROCEDURE DATE:  03/29/2014 PROCEDURE:   Colonoscopy, surveillance First Screening Colonoscopy - Avg.  risk and is 50 yrs.  old or older - No.  Prior Negative Screening - Now for repeat screening. N/A  History of Adenoma - Now for follow-up colonoscopy & has been > or = to 3 yrs.  Yes hx of adenoma.  Has been 3 or more years since last colonoscopy.  Polyps Removed Today? No.  Recommend repeat exam, <10 yrs? Yes.  High risk (family or personal hx). ASA CLASS:   Class II INDICATIONS:Patient's immediate family history of colon cancer (brother 60s)and Patient's personal history of adenomatous colon polyps. Prior examinations 1999, 2003, 2005, 2010. MEDICATIONS: MAC sedation, administered by CRNA and propofol (Diprivan) 160mg  IV  DESCRIPTION OF PROCEDURE:   After the risks benefits and alternatives of the procedure were thoroughly explained, informed consent was obtained.  A digital rectal exam revealed no abnormalities of the rectum.   The LB [id number]  endoscope was introduced through the anus and advanced to the cecum, which was identified by both the appendix and ileocecal valve. No adverse events experienced.   The quality of the prep was excellent, using MoviPrep  The instrument was then slowly withdrawn as the colon was fully examined.      COLON FINDINGS: A normal appearing cecum, ileocecal valve, and appendiceal orifice were identified.  The ascending, hepatic flexure, transverse, splenic flexure, descending, sigmoid colon and rectum appeared unremarkable.  No polyps or cancers were seen. Retroflexed views revealed no abnormalities. The time to cecum=2 minutes 18 seconds.  Withdrawal time=9 minutes 47  seconds.  The scope was withdrawn and the procedure completed.  COMPLICATIONS: There were no complications.  ENDOSCOPIC IMPRESSION: 1. Normal colon  RECOMMENDATIONS: 1. Follow up colonoscopy in 5 years (family and personal history)   eSigned:  Eustace Quail, MD 03/29/2014 11:53 AM   cc: The Patient and Maryella Shivers, MD

## 2014-03-29 NOTE — Patient Instructions (Signed)
YOU HAD AN ENDOSCOPIC PROCEDURE TODAY AT [facility]: Refer to the procedure report that was given to you for any specific questions about what was found during the examination.  If the procedure report does not answer your questions, please call your gastroenterologist to clarify.  If you requested that your care partner not be given the details of your procedure findings, then the procedure report has been included in a sealed envelope for you to review at your convenience later.  YOU SHOULD EXPECT: Some feelings of bloating in the abdomen. Passage of more gas than usual.  Walking can help get rid of the air that was put into your GI tract during the procedure and reduce the bloating. If you had a lower endoscopy (such as a colonoscopy or flexible sigmoidoscopy) you may notice spotting of blood in your stool or on the toilet paper. If you underwent a bowel prep for your procedure, then you may not have a normal bowel movement for a few days.  DIET: Your first meal following the procedure should be a light meal and then it is ok to progress to your normal diet.  A half-sandwich or bowl of soup is an example of a good first meal.  Heavy or fried foods are harder to digest and may make you feel nauseous or bloated.  Likewise meals heavy in dairy and vegetables can cause extra gas to form and this can also increase the bloating.  Drink plenty of fluids but you should avoid alcoholic beverages for 24 hours.  ACTIVITY: Your care partner should take you home directly after the procedure.  You should plan to take it easy, moving slowly for the rest of the day.  You can resume normal activity the day after the procedure however you should NOT DRIVE or use heavy machinery for 24 hours (because of the sedation medicines used during the test).    SYMPTOMS TO REPORT IMMEDIATELY: A gastroenterologist can be reached at any hour.  During normal business hours, 8:30 AM to 5:00 PM Monday through Friday,  call [phone].  After hours and on weekends, please call the GI answering service at [phone] who will take a message and have the physician on call contact you.   Following lower endoscopy (colonoscopy or flexible sigmoidoscopy):  Excessive amounts of blood in the stool  Significant tenderness or worsening of abdominal pains  Swelling of the abdomen that is new, acute  Fever of 100F or higher  FOLLOW UP: Our staff will call the home number listed on your records the next business day following your procedure to check on you and address any questions or concerns that you may have at that time regarding the information given to you following your procedure. This is a courtesy call and so if there is no answer at the home number and we have not heard from you through the emergency physician on call, we will assume that you have returned to your regular daily activities without incident.  SIGNATURES/CONFIDENTIALITY: You and/or your care partner have signed paperwork which will be entered into your electronic medical record.  These signatures attest to the fact that that the information above on your After Visit Summary has been reviewed and is understood.  Full responsibility of the confidentiality of this discharge information lies with you and/or your care-partner.  Continue your normal medications  Next colonoscopy- 5 years

## 2014-03-29 NOTE — Progress Notes (Signed)
A/ox3 pleased with MAC, report to Annette RN 

## 2014-03-30 ENCOUNTER — Telehealth: Payer: Self-pay | Admitting: *Deleted

## 2014-03-30 NOTE — Telephone Encounter (Signed)
  Follow up Call-  Call back number 03/29/2014  Post procedure Call Back phone  # [phone]  Permission to leave phone message Yes     Patient questions:  Do you have a fever, pain , or abdominal swelling? No. Pain Score  0 *  Have you tolerated food without any problems? Yes.    Have you been able to return to your normal activities? Yes.    Do you have any questions about your discharge instructions: Diet   No. Medications  No. Follow up visit  No.  Do you have questions or concerns about your Care? No.  Actions: * If pain score is 4 or above: No action needed, pain <4.

## 2014-04-13 DIAGNOSIS — I4892 Unspecified atrial flutter: Secondary | ICD-10-CM | POA: Diagnosis not present

## 2014-04-13 DIAGNOSIS — Z79899 Other long term (current) drug therapy: Secondary | ICD-10-CM | POA: Diagnosis not present

## 2014-05-03 DIAGNOSIS — F330 Major depressive disorder, recurrent, mild: Secondary | ICD-10-CM | POA: Diagnosis not present

## 2014-05-03 DIAGNOSIS — F411 Generalized anxiety disorder: Secondary | ICD-10-CM | POA: Diagnosis not present

## 2014-05-04 DIAGNOSIS — I4891 Unspecified atrial fibrillation: Secondary | ICD-10-CM | POA: Diagnosis not present

## 2014-05-04 DIAGNOSIS — I4892 Unspecified atrial flutter: Secondary | ICD-10-CM | POA: Diagnosis not present

## 2014-05-04 DIAGNOSIS — Z79899 Other long term (current) drug therapy: Secondary | ICD-10-CM | POA: Diagnosis not present

## 2014-05-16 DIAGNOSIS — I495 Sick sinus syndrome: Secondary | ICD-10-CM | POA: Diagnosis not present

## 2014-05-16 DIAGNOSIS — I4892 Unspecified atrial flutter: Secondary | ICD-10-CM | POA: Diagnosis not present

## 2014-05-16 DIAGNOSIS — Z79899 Other long term (current) drug therapy: Secondary | ICD-10-CM | POA: Diagnosis not present

## 2014-05-16 DIAGNOSIS — I498 Other specified cardiac arrhythmias: Secondary | ICD-10-CM | POA: Diagnosis not present

## 2014-06-01 DIAGNOSIS — R112 Nausea with vomiting, unspecified: Secondary | ICD-10-CM | POA: Diagnosis not present

## 2014-06-01 DIAGNOSIS — R51 Headache: Secondary | ICD-10-CM | POA: Diagnosis not present

## 2014-06-01 DIAGNOSIS — Z7982 Long term (current) use of aspirin: Secondary | ICD-10-CM | POA: Diagnosis not present

## 2014-06-01 DIAGNOSIS — R110 Nausea: Secondary | ICD-10-CM | POA: Diagnosis not present

## 2014-06-01 DIAGNOSIS — H6691 Otitis media, unspecified, right ear: Secondary | ICD-10-CM | POA: Diagnosis not present

## 2014-06-01 DIAGNOSIS — Z886 Allergy status to analgesic agent status: Secondary | ICD-10-CM | POA: Diagnosis not present

## 2014-06-01 DIAGNOSIS — R509 Fever, unspecified: Secondary | ICD-10-CM | POA: Diagnosis not present

## 2014-06-01 DIAGNOSIS — Z79899 Other long term (current) drug therapy: Secondary | ICD-10-CM | POA: Diagnosis not present

## 2014-06-01 DIAGNOSIS — Z87820 Personal history of traumatic brain injury: Secondary | ICD-10-CM | POA: Diagnosis not present

## 2014-06-01 DIAGNOSIS — H748X1 Other specified disorders of right middle ear and mastoid: Secondary | ICD-10-CM | POA: Diagnosis not present

## 2014-06-01 DIAGNOSIS — R609 Edema, unspecified: Secondary | ICD-10-CM | POA: Diagnosis not present

## 2014-06-20 DIAGNOSIS — I4892 Unspecified atrial flutter: Secondary | ICD-10-CM | POA: Diagnosis not present

## 2014-06-20 DIAGNOSIS — E785 Hyperlipidemia, unspecified: Secondary | ICD-10-CM | POA: Diagnosis not present

## 2014-06-20 DIAGNOSIS — I4891 Unspecified atrial fibrillation: Secondary | ICD-10-CM | POA: Diagnosis not present

## 2014-06-20 DIAGNOSIS — Z87820 Personal history of traumatic brain injury: Secondary | ICD-10-CM | POA: Diagnosis not present

## 2014-06-20 DIAGNOSIS — I495 Sick sinus syndrome: Secondary | ICD-10-CM | POA: Diagnosis not present

## 2014-06-20 DIAGNOSIS — R001 Bradycardia, unspecified: Secondary | ICD-10-CM | POA: Diagnosis not present

## 2014-06-23 DIAGNOSIS — R0602 Shortness of breath: Secondary | ICD-10-CM | POA: Diagnosis not present

## 2014-06-23 DIAGNOSIS — I4891 Unspecified atrial fibrillation: Secondary | ICD-10-CM | POA: Diagnosis not present

## 2014-06-23 DIAGNOSIS — I495 Sick sinus syndrome: Secondary | ICD-10-CM | POA: Diagnosis not present

## 2014-06-28 DIAGNOSIS — J45909 Unspecified asthma, uncomplicated: Secondary | ICD-10-CM | POA: Diagnosis not present

## 2014-06-28 DIAGNOSIS — I480 Paroxysmal atrial fibrillation: Secondary | ICD-10-CM | POA: Diagnosis not present

## 2014-06-28 DIAGNOSIS — R001 Bradycardia, unspecified: Secondary | ICD-10-CM | POA: Diagnosis not present

## 2014-06-28 DIAGNOSIS — Z7901 Long term (current) use of anticoagulants: Secondary | ICD-10-CM | POA: Diagnosis not present

## 2014-06-28 DIAGNOSIS — I495 Sick sinus syndrome: Secondary | ICD-10-CM | POA: Diagnosis not present

## 2014-06-28 DIAGNOSIS — Z79899 Other long term (current) drug therapy: Secondary | ICD-10-CM | POA: Diagnosis not present

## 2014-06-29 DIAGNOSIS — I480 Paroxysmal atrial fibrillation: Secondary | ICD-10-CM | POA: Diagnosis not present

## 2014-06-29 DIAGNOSIS — Z45018 Encounter for adjustment and management of other part of cardiac pacemaker: Secondary | ICD-10-CM | POA: Diagnosis not present

## 2014-06-29 DIAGNOSIS — Z950 Presence of cardiac pacemaker: Secondary | ICD-10-CM | POA: Diagnosis not present

## 2014-06-29 DIAGNOSIS — Z79899 Other long term (current) drug therapy: Secondary | ICD-10-CM | POA: Diagnosis not present

## 2014-06-29 DIAGNOSIS — I495 Sick sinus syndrome: Secondary | ICD-10-CM | POA: Diagnosis not present

## 2014-06-29 DIAGNOSIS — J9811 Atelectasis: Secondary | ICD-10-CM | POA: Diagnosis not present

## 2014-06-29 DIAGNOSIS — Z7901 Long term (current) use of anticoagulants: Secondary | ICD-10-CM | POA: Diagnosis not present

## 2014-06-29 DIAGNOSIS — R001 Bradycardia, unspecified: Secondary | ICD-10-CM | POA: Diagnosis not present

## 2014-06-29 DIAGNOSIS — J45909 Unspecified asthma, uncomplicated: Secondary | ICD-10-CM | POA: Diagnosis not present

## 2014-06-30 DIAGNOSIS — Z45018 Encounter for adjustment and management of other part of cardiac pacemaker: Secondary | ICD-10-CM | POA: Diagnosis not present

## 2014-06-30 DIAGNOSIS — I480 Paroxysmal atrial fibrillation: Secondary | ICD-10-CM | POA: Diagnosis not present

## 2014-06-30 DIAGNOSIS — I495 Sick sinus syndrome: Secondary | ICD-10-CM | POA: Diagnosis not present

## 2014-06-30 DIAGNOSIS — Z7901 Long term (current) use of anticoagulants: Secondary | ICD-10-CM | POA: Diagnosis not present

## 2014-06-30 DIAGNOSIS — R001 Bradycardia, unspecified: Secondary | ICD-10-CM | POA: Diagnosis not present

## 2014-06-30 DIAGNOSIS — J45909 Unspecified asthma, uncomplicated: Secondary | ICD-10-CM | POA: Diagnosis not present

## 2014-06-30 DIAGNOSIS — Z79899 Other long term (current) drug therapy: Secondary | ICD-10-CM | POA: Diagnosis not present

## 2014-06-30 DIAGNOSIS — Z950 Presence of cardiac pacemaker: Secondary | ICD-10-CM | POA: Diagnosis not present

## 2014-07-10 DIAGNOSIS — N401 Enlarged prostate with lower urinary tract symptoms: Secondary | ICD-10-CM | POA: Diagnosis not present

## 2014-07-10 DIAGNOSIS — R972 Elevated prostate specific antigen [PSA]: Secondary | ICD-10-CM | POA: Diagnosis not present

## 2014-07-12 DIAGNOSIS — I495 Sick sinus syndrome: Secondary | ICD-10-CM | POA: Diagnosis not present

## 2014-07-12 DIAGNOSIS — Z950 Presence of cardiac pacemaker: Secondary | ICD-10-CM | POA: Diagnosis not present

## 2014-07-17 DIAGNOSIS — H4011X2 Primary open-angle glaucoma, moderate stage: Secondary | ICD-10-CM | POA: Diagnosis not present

## 2014-07-19 DIAGNOSIS — E663 Overweight: Secondary | ICD-10-CM | POA: Diagnosis not present

## 2014-07-19 DIAGNOSIS — Z09 Encounter for follow-up examination after completed treatment for conditions other than malignant neoplasm: Secondary | ICD-10-CM | POA: Diagnosis not present

## 2014-07-25 DIAGNOSIS — Z950 Presence of cardiac pacemaker: Secondary | ICD-10-CM | POA: Diagnosis not present

## 2014-07-25 DIAGNOSIS — E785 Hyperlipidemia, unspecified: Secondary | ICD-10-CM | POA: Diagnosis not present

## 2014-07-25 DIAGNOSIS — I495 Sick sinus syndrome: Secondary | ICD-10-CM | POA: Diagnosis not present

## 2014-07-25 DIAGNOSIS — I4891 Unspecified atrial fibrillation: Secondary | ICD-10-CM | POA: Diagnosis not present

## 2014-08-03 DIAGNOSIS — Z87820 Personal history of traumatic brain injury: Secondary | ICD-10-CM | POA: Diagnosis not present

## 2014-08-03 DIAGNOSIS — H9041 Sensorineural hearing loss, unilateral, right ear, with unrestricted hearing on the contralateral side: Secondary | ICD-10-CM | POA: Diagnosis not present

## 2014-08-23 DIAGNOSIS — F330 Major depressive disorder, recurrent, mild: Secondary | ICD-10-CM | POA: Diagnosis not present

## 2014-08-30 DIAGNOSIS — L821 Other seborrheic keratosis: Secondary | ICD-10-CM | POA: Diagnosis not present

## 2014-08-30 DIAGNOSIS — D224 Melanocytic nevi of scalp and neck: Secondary | ICD-10-CM | POA: Diagnosis not present

## 2014-08-30 DIAGNOSIS — D485 Neoplasm of uncertain behavior of skin: Secondary | ICD-10-CM | POA: Diagnosis not present

## 2014-09-28 DIAGNOSIS — R29818 Other symptoms and signs involving the nervous system: Secondary | ICD-10-CM | POA: Diagnosis not present

## 2014-09-28 DIAGNOSIS — S066X0S Traumatic subarachnoid hemorrhage without loss of consciousness, sequela: Secondary | ICD-10-CM | POA: Diagnosis not present

## 2014-09-28 DIAGNOSIS — R4189 Other symptoms and signs involving cognitive functions and awareness: Secondary | ICD-10-CM | POA: Diagnosis not present

## 2014-09-28 DIAGNOSIS — H918X1 Other specified hearing loss, right ear: Secondary | ICD-10-CM | POA: Diagnosis not present

## 2014-09-28 DIAGNOSIS — S020XXA Fracture of vault of skull, initial encounter for closed fracture: Secondary | ICD-10-CM | POA: Diagnosis not present

## 2014-10-02 DIAGNOSIS — Z950 Presence of cardiac pacemaker: Secondary | ICD-10-CM | POA: Diagnosis not present

## 2014-10-02 DIAGNOSIS — I480 Paroxysmal atrial fibrillation: Secondary | ICD-10-CM | POA: Diagnosis not present

## 2014-10-02 DIAGNOSIS — Z79899 Other long term (current) drug therapy: Secondary | ICD-10-CM | POA: Diagnosis not present

## 2014-10-02 DIAGNOSIS — I4892 Unspecified atrial flutter: Secondary | ICD-10-CM | POA: Diagnosis not present

## 2014-10-18 DIAGNOSIS — Z45010 Encounter for checking and testing of cardiac pacemaker pulse generator [battery]: Secondary | ICD-10-CM | POA: Diagnosis not present

## 2014-10-18 DIAGNOSIS — I495 Sick sinus syndrome: Secondary | ICD-10-CM | POA: Diagnosis not present

## 2014-11-16 DIAGNOSIS — H4011X2 Primary open-angle glaucoma, moderate stage: Secondary | ICD-10-CM | POA: Diagnosis not present

## 2014-11-16 DIAGNOSIS — H2513 Age-related nuclear cataract, bilateral: Secondary | ICD-10-CM | POA: Diagnosis not present

## 2014-11-16 DIAGNOSIS — H524 Presbyopia: Secondary | ICD-10-CM | POA: Diagnosis not present

## 2014-12-12 DIAGNOSIS — Z139 Encounter for screening, unspecified: Secondary | ICD-10-CM | POA: Diagnosis not present

## 2014-12-12 DIAGNOSIS — Z0000 Encounter for general adult medical examination without abnormal findings: Secondary | ICD-10-CM | POA: Diagnosis not present

## 2014-12-12 DIAGNOSIS — Z1389 Encounter for screening for other disorder: Secondary | ICD-10-CM | POA: Diagnosis not present

## 2015-01-08 DIAGNOSIS — R972 Elevated prostate specific antigen [PSA]: Secondary | ICD-10-CM | POA: Diagnosis not present

## 2015-01-08 DIAGNOSIS — N401 Enlarged prostate with lower urinary tract symptoms: Secondary | ICD-10-CM | POA: Diagnosis not present

## 2015-01-23 DIAGNOSIS — Z45010 Encounter for checking and testing of cardiac pacemaker pulse generator [battery]: Secondary | ICD-10-CM | POA: Diagnosis not present

## 2015-01-23 DIAGNOSIS — I498 Other specified cardiac arrhythmias: Secondary | ICD-10-CM | POA: Diagnosis not present

## 2015-02-22 DIAGNOSIS — I480 Paroxysmal atrial fibrillation: Secondary | ICD-10-CM | POA: Insufficient documentation

## 2015-02-22 DIAGNOSIS — Z79899 Other long term (current) drug therapy: Secondary | ICD-10-CM

## 2015-02-22 DIAGNOSIS — I4892 Unspecified atrial flutter: Secondary | ICD-10-CM | POA: Insufficient documentation

## 2015-02-22 DIAGNOSIS — Z950 Presence of cardiac pacemaker: Secondary | ICD-10-CM

## 2015-02-22 HISTORY — DX: Paroxysmal atrial fibrillation: I48.0

## 2015-02-22 HISTORY — DX: Other long term (current) drug therapy: Z79.899

## 2015-02-22 HISTORY — DX: Presence of cardiac pacemaker: Z95.0

## 2015-02-23 DIAGNOSIS — Z79899 Other long term (current) drug therapy: Secondary | ICD-10-CM | POA: Diagnosis not present

## 2015-02-23 DIAGNOSIS — I480 Paroxysmal atrial fibrillation: Secondary | ICD-10-CM | POA: Diagnosis not present

## 2015-02-23 DIAGNOSIS — Z6823 Body mass index (BMI) 23.0-23.9, adult: Secondary | ICD-10-CM | POA: Diagnosis not present

## 2015-02-23 DIAGNOSIS — Z950 Presence of cardiac pacemaker: Secondary | ICD-10-CM | POA: Diagnosis not present

## 2015-03-09 DIAGNOSIS — H9190 Unspecified hearing loss, unspecified ear: Secondary | ICD-10-CM | POA: Diagnosis not present

## 2015-03-09 DIAGNOSIS — J342 Deviated nasal septum: Secondary | ICD-10-CM | POA: Diagnosis not present

## 2015-03-09 DIAGNOSIS — R0981 Nasal congestion: Secondary | ICD-10-CM | POA: Diagnosis not present

## 2015-03-09 DIAGNOSIS — J3489 Other specified disorders of nose and nasal sinuses: Secondary | ICD-10-CM | POA: Diagnosis not present

## 2015-03-16 DIAGNOSIS — R0981 Nasal congestion: Secondary | ICD-10-CM | POA: Diagnosis not present

## 2015-03-16 DIAGNOSIS — G9389 Other specified disorders of brain: Secondary | ICD-10-CM | POA: Diagnosis not present

## 2015-03-16 DIAGNOSIS — J329 Chronic sinusitis, unspecified: Secondary | ICD-10-CM | POA: Diagnosis not present

## 2015-03-16 DIAGNOSIS — R609 Edema, unspecified: Secondary | ICD-10-CM | POA: Diagnosis not present

## 2015-03-20 DIAGNOSIS — J343 Hypertrophy of nasal turbinates: Secondary | ICD-10-CM | POA: Diagnosis not present

## 2015-03-20 DIAGNOSIS — J320 Chronic maxillary sinusitis: Secondary | ICD-10-CM | POA: Diagnosis not present

## 2015-03-20 DIAGNOSIS — J342 Deviated nasal septum: Secondary | ICD-10-CM | POA: Diagnosis not present

## 2015-04-02 DIAGNOSIS — M1990 Unspecified osteoarthritis, unspecified site: Secondary | ICD-10-CM | POA: Diagnosis not present

## 2015-04-02 DIAGNOSIS — H9193 Unspecified hearing loss, bilateral: Secondary | ICD-10-CM | POA: Diagnosis not present

## 2015-04-02 DIAGNOSIS — R413 Other amnesia: Secondary | ICD-10-CM | POA: Diagnosis not present

## 2015-04-02 DIAGNOSIS — I4892 Unspecified atrial flutter: Secondary | ICD-10-CM | POA: Diagnosis not present

## 2015-04-02 DIAGNOSIS — N400 Enlarged prostate without lower urinary tract symptoms: Secondary | ICD-10-CM | POA: Diagnosis not present

## 2015-04-02 DIAGNOSIS — I951 Orthostatic hypotension: Secondary | ICD-10-CM | POA: Diagnosis not present

## 2015-04-02 DIAGNOSIS — S069X9A Unspecified intracranial injury with loss of consciousness of unspecified duration, initial encounter: Secondary | ICD-10-CM | POA: Diagnosis not present

## 2015-04-02 DIAGNOSIS — Z6824 Body mass index (BMI) 24.0-24.9, adult: Secondary | ICD-10-CM | POA: Diagnosis not present

## 2015-04-02 DIAGNOSIS — Z79899 Other long term (current) drug therapy: Secondary | ICD-10-CM | POA: Diagnosis not present

## 2015-04-02 DIAGNOSIS — E785 Hyperlipidemia, unspecified: Secondary | ICD-10-CM | POA: Diagnosis not present

## 2015-04-02 DIAGNOSIS — J342 Deviated nasal septum: Secondary | ICD-10-CM | POA: Diagnosis not present

## 2015-04-02 DIAGNOSIS — E559 Vitamin D deficiency, unspecified: Secondary | ICD-10-CM | POA: Diagnosis not present

## 2015-04-23 DIAGNOSIS — I498 Other specified cardiac arrhythmias: Secondary | ICD-10-CM | POA: Diagnosis not present

## 2015-04-23 DIAGNOSIS — E785 Hyperlipidemia, unspecified: Secondary | ICD-10-CM | POA: Diagnosis not present

## 2015-04-23 DIAGNOSIS — Z45010 Encounter for checking and testing of cardiac pacemaker pulse generator [battery]: Secondary | ICD-10-CM | POA: Diagnosis not present

## 2015-05-02 DIAGNOSIS — Z23 Encounter for immunization: Secondary | ICD-10-CM | POA: Diagnosis not present

## 2015-05-02 DIAGNOSIS — R0981 Nasal congestion: Secondary | ICD-10-CM | POA: Diagnosis not present

## 2015-05-02 DIAGNOSIS — J342 Deviated nasal septum: Secondary | ICD-10-CM | POA: Diagnosis not present

## 2015-05-02 DIAGNOSIS — J343 Hypertrophy of nasal turbinates: Secondary | ICD-10-CM | POA: Diagnosis not present

## 2015-05-17 DIAGNOSIS — H4011X2 Primary open-angle glaucoma, moderate stage: Secondary | ICD-10-CM | POA: Diagnosis not present

## 2015-05-21 DIAGNOSIS — R410 Disorientation, unspecified: Secondary | ICD-10-CM | POA: Diagnosis not present

## 2015-05-21 DIAGNOSIS — R4182 Altered mental status, unspecified: Secondary | ICD-10-CM | POA: Diagnosis not present

## 2015-05-21 DIAGNOSIS — I482 Chronic atrial fibrillation: Secondary | ICD-10-CM | POA: Diagnosis not present

## 2015-05-21 DIAGNOSIS — R42 Dizziness and giddiness: Secondary | ICD-10-CM | POA: Diagnosis not present

## 2015-05-21 DIAGNOSIS — G459 Transient cerebral ischemic attack, unspecified: Secondary | ICD-10-CM | POA: Diagnosis not present

## 2015-05-22 DIAGNOSIS — I4892 Unspecified atrial flutter: Secondary | ICD-10-CM | POA: Diagnosis not present

## 2015-05-22 DIAGNOSIS — Z8249 Family history of ischemic heart disease and other diseases of the circulatory system: Secondary | ICD-10-CM | POA: Diagnosis not present

## 2015-05-22 DIAGNOSIS — I639 Cerebral infarction, unspecified: Secondary | ICD-10-CM | POA: Diagnosis not present

## 2015-05-22 DIAGNOSIS — H409 Unspecified glaucoma: Secondary | ICD-10-CM | POA: Diagnosis present

## 2015-05-22 DIAGNOSIS — F329 Major depressive disorder, single episode, unspecified: Secondary | ICD-10-CM | POA: Diagnosis not present

## 2015-05-22 DIAGNOSIS — R4182 Altered mental status, unspecified: Secondary | ICD-10-CM | POA: Diagnosis not present

## 2015-05-22 DIAGNOSIS — G40219 Localization-related (focal) (partial) symptomatic epilepsy and epileptic syndromes with complex partial seizures, intractable, without status epilepticus: Secondary | ICD-10-CM | POA: Diagnosis present

## 2015-05-22 DIAGNOSIS — Z950 Presence of cardiac pacemaker: Secondary | ICD-10-CM | POA: Diagnosis not present

## 2015-05-22 DIAGNOSIS — I6523 Occlusion and stenosis of bilateral carotid arteries: Secondary | ICD-10-CM | POA: Diagnosis not present

## 2015-05-22 DIAGNOSIS — R739 Hyperglycemia, unspecified: Secondary | ICD-10-CM | POA: Diagnosis not present

## 2015-05-22 DIAGNOSIS — I4891 Unspecified atrial fibrillation: Secondary | ICD-10-CM | POA: Diagnosis not present

## 2015-05-22 DIAGNOSIS — E785 Hyperlipidemia, unspecified: Secondary | ICD-10-CM | POA: Diagnosis not present

## 2015-05-22 DIAGNOSIS — Z823 Family history of stroke: Secondary | ICD-10-CM | POA: Diagnosis not present

## 2015-05-22 DIAGNOSIS — Z87820 Personal history of traumatic brain injury: Secondary | ICD-10-CM | POA: Diagnosis not present

## 2015-05-22 DIAGNOSIS — G459 Transient cerebral ischemic attack, unspecified: Secondary | ICD-10-CM | POA: Diagnosis not present

## 2015-05-22 DIAGNOSIS — R569 Unspecified convulsions: Secondary | ICD-10-CM | POA: Diagnosis not present

## 2015-05-22 DIAGNOSIS — F4310 Post-traumatic stress disorder, unspecified: Secondary | ICD-10-CM | POA: Diagnosis present

## 2015-05-22 DIAGNOSIS — R410 Disorientation, unspecified: Secondary | ICD-10-CM | POA: Diagnosis not present

## 2015-05-22 DIAGNOSIS — I361 Nonrheumatic tricuspid (valve) insufficiency: Secondary | ICD-10-CM | POA: Diagnosis not present

## 2015-05-22 DIAGNOSIS — Z7982 Long term (current) use of aspirin: Secondary | ICD-10-CM | POA: Diagnosis not present

## 2015-05-22 DIAGNOSIS — I482 Chronic atrial fibrillation: Secondary | ICD-10-CM | POA: Diagnosis not present

## 2015-05-22 DIAGNOSIS — I340 Nonrheumatic mitral (valve) insufficiency: Secondary | ICD-10-CM | POA: Diagnosis not present

## 2015-05-22 DIAGNOSIS — I10 Essential (primary) hypertension: Secondary | ICD-10-CM | POA: Diagnosis present

## 2015-05-23 DIAGNOSIS — I341 Nonrheumatic mitral (valve) prolapse: Secondary | ICD-10-CM | POA: Diagnosis present

## 2015-05-23 DIAGNOSIS — R569 Unspecified convulsions: Secondary | ICD-10-CM | POA: Diagnosis not present

## 2015-05-23 DIAGNOSIS — N183 Chronic kidney disease, stage 3 (moderate): Secondary | ICD-10-CM | POA: Diagnosis present

## 2015-05-23 DIAGNOSIS — Z7901 Long term (current) use of anticoagulants: Secondary | ICD-10-CM | POA: Diagnosis not present

## 2015-05-23 DIAGNOSIS — E1140 Type 2 diabetes mellitus with diabetic neuropathy, unspecified: Secondary | ICD-10-CM | POA: Diagnosis present

## 2015-05-23 DIAGNOSIS — E780 Pure hypercholesterolemia: Secondary | ICD-10-CM | POA: Diagnosis present

## 2015-05-23 DIAGNOSIS — E1122 Type 2 diabetes mellitus with diabetic chronic kidney disease: Secondary | ICD-10-CM | POA: Diagnosis present

## 2015-05-23 DIAGNOSIS — I4891 Unspecified atrial fibrillation: Secondary | ICD-10-CM | POA: Diagnosis present

## 2015-05-23 DIAGNOSIS — Z950 Presence of cardiac pacemaker: Secondary | ICD-10-CM | POA: Diagnosis not present

## 2015-05-23 DIAGNOSIS — Z79899 Other long term (current) drug therapy: Secondary | ICD-10-CM | POA: Diagnosis not present

## 2015-05-23 DIAGNOSIS — M1990 Unspecified osteoarthritis, unspecified site: Secondary | ICD-10-CM | POA: Diagnosis present

## 2015-05-23 DIAGNOSIS — N393 Stress incontinence (female) (male): Secondary | ICD-10-CM | POA: Diagnosis present

## 2015-05-23 DIAGNOSIS — Z87820 Personal history of traumatic brain injury: Secondary | ICD-10-CM | POA: Diagnosis not present

## 2015-05-23 DIAGNOSIS — I129 Hypertensive chronic kidney disease with stage 1 through stage 4 chronic kidney disease, or unspecified chronic kidney disease: Secondary | ICD-10-CM | POA: Diagnosis present

## 2015-05-23 DIAGNOSIS — F329 Major depressive disorder, single episode, unspecified: Secondary | ICD-10-CM | POA: Diagnosis present

## 2015-05-23 DIAGNOSIS — Z886 Allergy status to analgesic agent status: Secondary | ICD-10-CM | POA: Diagnosis not present

## 2015-05-23 DIAGNOSIS — F419 Anxiety disorder, unspecified: Secondary | ICD-10-CM | POA: Diagnosis present

## 2015-05-23 DIAGNOSIS — Z7982 Long term (current) use of aspirin: Secondary | ICD-10-CM | POA: Diagnosis not present

## 2015-05-23 DIAGNOSIS — G40209 Localization-related (focal) (partial) symptomatic epilepsy and epileptic syndromes with complex partial seizures, not intractable, without status epilepticus: Secondary | ICD-10-CM | POA: Diagnosis present

## 2015-05-23 HISTORY — DX: Unspecified convulsions: R56.9

## 2015-05-31 DIAGNOSIS — R269 Unspecified abnormalities of gait and mobility: Secondary | ICD-10-CM | POA: Diagnosis not present

## 2015-05-31 DIAGNOSIS — G40909 Epilepsy, unspecified, not intractable, without status epilepticus: Secondary | ICD-10-CM | POA: Diagnosis not present

## 2015-05-31 DIAGNOSIS — Z87820 Personal history of traumatic brain injury: Secondary | ICD-10-CM | POA: Diagnosis not present

## 2015-06-04 DIAGNOSIS — R0781 Pleurodynia: Secondary | ICD-10-CM | POA: Diagnosis not present

## 2015-06-04 DIAGNOSIS — Z6824 Body mass index (BMI) 24.0-24.9, adult: Secondary | ICD-10-CM | POA: Diagnosis not present

## 2015-06-04 DIAGNOSIS — G40909 Epilepsy, unspecified, not intractable, without status epilepticus: Secondary | ICD-10-CM | POA: Diagnosis not present

## 2015-06-04 DIAGNOSIS — Z9181 History of falling: Secondary | ICD-10-CM | POA: Diagnosis not present

## 2015-06-04 DIAGNOSIS — S2242XA Multiple fractures of ribs, left side, initial encounter for closed fracture: Secondary | ICD-10-CM | POA: Diagnosis not present

## 2015-06-12 DIAGNOSIS — K5900 Constipation, unspecified: Secondary | ICD-10-CM | POA: Diagnosis not present

## 2015-06-12 DIAGNOSIS — R109 Unspecified abdominal pain: Secondary | ICD-10-CM | POA: Diagnosis not present

## 2015-06-12 DIAGNOSIS — G40909 Epilepsy, unspecified, not intractable, without status epilepticus: Secondary | ICD-10-CM | POA: Diagnosis not present

## 2015-06-12 DIAGNOSIS — G9389 Other specified disorders of brain: Secondary | ICD-10-CM | POA: Diagnosis not present

## 2015-06-12 DIAGNOSIS — Z87820 Personal history of traumatic brain injury: Secondary | ICD-10-CM | POA: Diagnosis not present

## 2015-06-20 DIAGNOSIS — S2242XD Multiple fractures of ribs, left side, subsequent encounter for fracture with routine healing: Secondary | ICD-10-CM | POA: Diagnosis not present

## 2015-06-22 DIAGNOSIS — S2242XD Multiple fractures of ribs, left side, subsequent encounter for fracture with routine healing: Secondary | ICD-10-CM | POA: Diagnosis not present

## 2015-06-25 DIAGNOSIS — M62552 Muscle wasting and atrophy, not elsewhere classified, left thigh: Secondary | ICD-10-CM | POA: Diagnosis not present

## 2015-06-25 DIAGNOSIS — R2689 Other abnormalities of gait and mobility: Secondary | ICD-10-CM | POA: Diagnosis not present

## 2015-06-25 DIAGNOSIS — R42 Dizziness and giddiness: Secondary | ICD-10-CM | POA: Diagnosis not present

## 2015-06-25 DIAGNOSIS — S2242XD Multiple fractures of ribs, left side, subsequent encounter for fracture with routine healing: Secondary | ICD-10-CM | POA: Diagnosis not present

## 2015-06-27 DIAGNOSIS — M62552 Muscle wasting and atrophy, not elsewhere classified, left thigh: Secondary | ICD-10-CM | POA: Diagnosis not present

## 2015-06-27 DIAGNOSIS — S2242XD Multiple fractures of ribs, left side, subsequent encounter for fracture with routine healing: Secondary | ICD-10-CM | POA: Diagnosis not present

## 2015-06-27 DIAGNOSIS — R42 Dizziness and giddiness: Secondary | ICD-10-CM | POA: Diagnosis not present

## 2015-06-27 DIAGNOSIS — R2689 Other abnormalities of gait and mobility: Secondary | ICD-10-CM | POA: Diagnosis not present

## 2015-06-29 DIAGNOSIS — R2689 Other abnormalities of gait and mobility: Secondary | ICD-10-CM | POA: Diagnosis not present

## 2015-06-29 DIAGNOSIS — S2242XD Multiple fractures of ribs, left side, subsequent encounter for fracture with routine healing: Secondary | ICD-10-CM | POA: Diagnosis not present

## 2015-06-29 DIAGNOSIS — M62552 Muscle wasting and atrophy, not elsewhere classified, left thigh: Secondary | ICD-10-CM | POA: Diagnosis not present

## 2015-06-29 DIAGNOSIS — R42 Dizziness and giddiness: Secondary | ICD-10-CM | POA: Diagnosis not present

## 2015-07-02 DIAGNOSIS — M62552 Muscle wasting and atrophy, not elsewhere classified, left thigh: Secondary | ICD-10-CM | POA: Diagnosis not present

## 2015-07-02 DIAGNOSIS — S2242XD Multiple fractures of ribs, left side, subsequent encounter for fracture with routine healing: Secondary | ICD-10-CM | POA: Diagnosis not present

## 2015-07-02 DIAGNOSIS — R2689 Other abnormalities of gait and mobility: Secondary | ICD-10-CM | POA: Diagnosis not present

## 2015-07-02 DIAGNOSIS — R42 Dizziness and giddiness: Secondary | ICD-10-CM | POA: Diagnosis not present

## 2015-07-04 DIAGNOSIS — S2242XD Multiple fractures of ribs, left side, subsequent encounter for fracture with routine healing: Secondary | ICD-10-CM | POA: Diagnosis not present

## 2015-07-04 DIAGNOSIS — R42 Dizziness and giddiness: Secondary | ICD-10-CM | POA: Diagnosis not present

## 2015-07-04 DIAGNOSIS — R2689 Other abnormalities of gait and mobility: Secondary | ICD-10-CM | POA: Diagnosis not present

## 2015-07-04 DIAGNOSIS — M62552 Muscle wasting and atrophy, not elsewhere classified, left thigh: Secondary | ICD-10-CM | POA: Diagnosis not present

## 2015-07-06 DIAGNOSIS — R42 Dizziness and giddiness: Secondary | ICD-10-CM | POA: Diagnosis not present

## 2015-07-06 DIAGNOSIS — M62552 Muscle wasting and atrophy, not elsewhere classified, left thigh: Secondary | ICD-10-CM | POA: Diagnosis not present

## 2015-07-06 DIAGNOSIS — R2689 Other abnormalities of gait and mobility: Secondary | ICD-10-CM | POA: Diagnosis not present

## 2015-07-06 DIAGNOSIS — S2242XD Multiple fractures of ribs, left side, subsequent encounter for fracture with routine healing: Secondary | ICD-10-CM | POA: Diagnosis not present

## 2015-07-10 DIAGNOSIS — H905 Unspecified sensorineural hearing loss: Secondary | ICD-10-CM | POA: Diagnosis not present

## 2015-07-10 DIAGNOSIS — H9319 Tinnitus, unspecified ear: Secondary | ICD-10-CM | POA: Diagnosis not present

## 2015-07-12 DIAGNOSIS — N401 Enlarged prostate with lower urinary tract symptoms: Secondary | ICD-10-CM | POA: Diagnosis not present

## 2015-07-12 DIAGNOSIS — R972 Elevated prostate specific antigen [PSA]: Secondary | ICD-10-CM | POA: Diagnosis not present

## 2015-07-13 DIAGNOSIS — R42 Dizziness and giddiness: Secondary | ICD-10-CM | POA: Diagnosis not present

## 2015-07-13 DIAGNOSIS — M62552 Muscle wasting and atrophy, not elsewhere classified, left thigh: Secondary | ICD-10-CM | POA: Diagnosis not present

## 2015-07-13 DIAGNOSIS — R2689 Other abnormalities of gait and mobility: Secondary | ICD-10-CM | POA: Diagnosis not present

## 2015-07-13 DIAGNOSIS — S2242XD Multiple fractures of ribs, left side, subsequent encounter for fracture with routine healing: Secondary | ICD-10-CM | POA: Diagnosis not present

## 2015-07-25 DIAGNOSIS — Z45018 Encounter for adjustment and management of other part of cardiac pacemaker: Secondary | ICD-10-CM | POA: Diagnosis not present

## 2015-07-25 DIAGNOSIS — I498 Other specified cardiac arrhythmias: Secondary | ICD-10-CM | POA: Diagnosis not present

## 2015-08-02 DIAGNOSIS — J342 Deviated nasal septum: Secondary | ICD-10-CM | POA: Diagnosis not present

## 2015-08-02 DIAGNOSIS — Z6825 Body mass index (BMI) 25.0-25.9, adult: Secondary | ICD-10-CM | POA: Diagnosis not present

## 2015-08-02 DIAGNOSIS — M1990 Unspecified osteoarthritis, unspecified site: Secondary | ICD-10-CM | POA: Diagnosis not present

## 2015-08-02 DIAGNOSIS — E785 Hyperlipidemia, unspecified: Secondary | ICD-10-CM | POA: Diagnosis not present

## 2015-08-02 DIAGNOSIS — E663 Overweight: Secondary | ICD-10-CM | POA: Diagnosis not present

## 2015-08-02 DIAGNOSIS — J309 Allergic rhinitis, unspecified: Secondary | ICD-10-CM | POA: Diagnosis not present

## 2015-08-03 ENCOUNTER — Encounter: Payer: Self-pay | Admitting: Internal Medicine

## 2015-10-01 DIAGNOSIS — Z8679 Personal history of other diseases of the circulatory system: Secondary | ICD-10-CM | POA: Diagnosis not present

## 2015-10-01 DIAGNOSIS — R569 Unspecified convulsions: Secondary | ICD-10-CM | POA: Diagnosis not present

## 2015-10-31 DIAGNOSIS — R001 Bradycardia, unspecified: Secondary | ICD-10-CM

## 2015-10-31 DIAGNOSIS — Z950 Presence of cardiac pacemaker: Secondary | ICD-10-CM | POA: Insufficient documentation

## 2015-10-31 DIAGNOSIS — Z45018 Encounter for adjustment and management of other part of cardiac pacemaker: Secondary | ICD-10-CM | POA: Insufficient documentation

## 2015-10-31 HISTORY — DX: Bradycardia, unspecified: R00.1

## 2015-10-31 HISTORY — DX: Encounter for adjustment and management of other part of cardiac pacemaker: Z45.018

## 2015-11-05 DIAGNOSIS — Z7951 Long term (current) use of inhaled steroids: Secondary | ICD-10-CM | POA: Diagnosis not present

## 2015-11-05 DIAGNOSIS — G40209 Localization-related (focal) (partial) symptomatic epilepsy and epileptic syndromes with complex partial seizures, not intractable, without status epilepticus: Secondary | ICD-10-CM

## 2015-11-05 DIAGNOSIS — Z87820 Personal history of traumatic brain injury: Secondary | ICD-10-CM | POA: Diagnosis not present

## 2015-11-05 DIAGNOSIS — G40909 Epilepsy, unspecified, not intractable, without status epilepticus: Secondary | ICD-10-CM | POA: Diagnosis present

## 2015-11-05 DIAGNOSIS — R569 Unspecified convulsions: Secondary | ICD-10-CM | POA: Diagnosis not present

## 2015-11-05 DIAGNOSIS — J45909 Unspecified asthma, uncomplicated: Secondary | ICD-10-CM | POA: Diagnosis present

## 2015-11-05 DIAGNOSIS — Z79899 Other long term (current) drug therapy: Secondary | ICD-10-CM | POA: Diagnosis not present

## 2015-11-05 DIAGNOSIS — Z791 Long term (current) use of non-steroidal anti-inflammatories (NSAID): Secondary | ICD-10-CM | POA: Diagnosis not present

## 2015-11-05 DIAGNOSIS — Z7982 Long term (current) use of aspirin: Secondary | ICD-10-CM | POA: Diagnosis not present

## 2015-11-05 HISTORY — DX: Localization-related (focal) (partial) symptomatic epilepsy and epileptic syndromes with complex partial seizures, not intractable, without status epilepticus: G40.209

## 2015-11-13 DIAGNOSIS — G40209 Localization-related (focal) (partial) symptomatic epilepsy and epileptic syndromes with complex partial seizures, not intractable, without status epilepticus: Secondary | ICD-10-CM | POA: Diagnosis not present

## 2015-11-13 DIAGNOSIS — R270 Ataxia, unspecified: Secondary | ICD-10-CM | POA: Diagnosis not present

## 2015-11-13 DIAGNOSIS — G629 Polyneuropathy, unspecified: Secondary | ICD-10-CM | POA: Diagnosis not present

## 2015-12-12 DIAGNOSIS — Z6824 Body mass index (BMI) 24.0-24.9, adult: Secondary | ICD-10-CM | POA: Diagnosis not present

## 2015-12-12 DIAGNOSIS — Z45018 Encounter for adjustment and management of other part of cardiac pacemaker: Secondary | ICD-10-CM | POA: Diagnosis not present

## 2015-12-12 DIAGNOSIS — Z79899 Other long term (current) drug therapy: Secondary | ICD-10-CM | POA: Diagnosis not present

## 2015-12-12 DIAGNOSIS — I480 Paroxysmal atrial fibrillation: Secondary | ICD-10-CM | POA: Diagnosis not present

## 2015-12-13 DIAGNOSIS — R001 Bradycardia, unspecified: Secondary | ICD-10-CM | POA: Diagnosis not present

## 2015-12-17 DIAGNOSIS — Z1389 Encounter for screening for other disorder: Secondary | ICD-10-CM | POA: Diagnosis not present

## 2015-12-17 DIAGNOSIS — E785 Hyperlipidemia, unspecified: Secondary | ICD-10-CM | POA: Diagnosis not present

## 2015-12-17 DIAGNOSIS — Z9181 History of falling: Secondary | ICD-10-CM | POA: Diagnosis not present

## 2015-12-17 DIAGNOSIS — J309 Allergic rhinitis, unspecified: Secondary | ICD-10-CM | POA: Diagnosis not present

## 2015-12-17 DIAGNOSIS — G40909 Epilepsy, unspecified, not intractable, without status epilepticus: Secondary | ICD-10-CM | POA: Diagnosis not present

## 2015-12-17 DIAGNOSIS — J342 Deviated nasal septum: Secondary | ICD-10-CM | POA: Diagnosis not present

## 2015-12-17 DIAGNOSIS — R7989 Other specified abnormal findings of blood chemistry: Secondary | ICD-10-CM | POA: Diagnosis not present

## 2015-12-17 DIAGNOSIS — M1990 Unspecified osteoarthritis, unspecified site: Secondary | ICD-10-CM | POA: Diagnosis not present

## 2015-12-17 DIAGNOSIS — Z6824 Body mass index (BMI) 24.0-24.9, adult: Secondary | ICD-10-CM | POA: Diagnosis not present

## 2015-12-17 DIAGNOSIS — I4892 Unspecified atrial flutter: Secondary | ICD-10-CM | POA: Diagnosis not present

## 2016-01-09 DIAGNOSIS — N401 Enlarged prostate with lower urinary tract symptoms: Secondary | ICD-10-CM | POA: Diagnosis not present

## 2016-01-09 DIAGNOSIS — R972 Elevated prostate specific antigen [PSA]: Secondary | ICD-10-CM | POA: Diagnosis not present

## 2016-01-09 DIAGNOSIS — R351 Nocturia: Secondary | ICD-10-CM | POA: Diagnosis not present

## 2016-01-22 DIAGNOSIS — R7989 Other specified abnormal findings of blood chemistry: Secondary | ICD-10-CM | POA: Diagnosis not present

## 2016-01-24 DIAGNOSIS — Z4502 Encounter for adjustment and management of automatic implantable cardiac defibrillator: Secondary | ICD-10-CM | POA: Diagnosis not present

## 2016-01-24 DIAGNOSIS — R001 Bradycardia, unspecified: Secondary | ICD-10-CM | POA: Diagnosis not present

## 2016-02-13 DIAGNOSIS — G40209 Localization-related (focal) (partial) symptomatic epilepsy and epileptic syndromes with complex partial seizures, not intractable, without status epilepticus: Secondary | ICD-10-CM | POA: Diagnosis not present

## 2016-02-13 DIAGNOSIS — R413 Other amnesia: Secondary | ICD-10-CM | POA: Diagnosis not present

## 2016-02-13 DIAGNOSIS — Z87820 Personal history of traumatic brain injury: Secondary | ICD-10-CM | POA: Diagnosis not present

## 2016-03-12 DIAGNOSIS — H903 Sensorineural hearing loss, bilateral: Secondary | ICD-10-CM | POA: Diagnosis not present

## 2016-03-13 DIAGNOSIS — R001 Bradycardia, unspecified: Secondary | ICD-10-CM | POA: Diagnosis not present

## 2016-04-17 DIAGNOSIS — H2513 Age-related nuclear cataract, bilateral: Secondary | ICD-10-CM | POA: Diagnosis not present

## 2016-04-17 DIAGNOSIS — H401132 Primary open-angle glaucoma, bilateral, moderate stage: Secondary | ICD-10-CM | POA: Diagnosis not present

## 2016-04-23 DIAGNOSIS — Z79899 Other long term (current) drug therapy: Secondary | ICD-10-CM | POA: Diagnosis not present

## 2016-04-23 DIAGNOSIS — M1990 Unspecified osteoarthritis, unspecified site: Secondary | ICD-10-CM | POA: Diagnosis not present

## 2016-04-23 DIAGNOSIS — Z6824 Body mass index (BMI) 24.0-24.9, adult: Secondary | ICD-10-CM | POA: Diagnosis not present

## 2016-04-23 DIAGNOSIS — E663 Overweight: Secondary | ICD-10-CM | POA: Diagnosis not present

## 2016-04-23 DIAGNOSIS — H9193 Unspecified hearing loss, bilateral: Secondary | ICD-10-CM | POA: Diagnosis not present

## 2016-04-23 DIAGNOSIS — R2681 Unsteadiness on feet: Secondary | ICD-10-CM | POA: Diagnosis not present

## 2016-04-23 DIAGNOSIS — E785 Hyperlipidemia, unspecified: Secondary | ICD-10-CM | POA: Diagnosis not present

## 2016-04-23 DIAGNOSIS — I4892 Unspecified atrial flutter: Secondary | ICD-10-CM | POA: Diagnosis not present

## 2016-06-10 DIAGNOSIS — Z23 Encounter for immunization: Secondary | ICD-10-CM | POA: Diagnosis not present

## 2016-06-12 DIAGNOSIS — Z79899 Other long term (current) drug therapy: Secondary | ICD-10-CM | POA: Diagnosis not present

## 2016-06-12 DIAGNOSIS — Z950 Presence of cardiac pacemaker: Secondary | ICD-10-CM | POA: Diagnosis not present

## 2016-06-12 DIAGNOSIS — I480 Paroxysmal atrial fibrillation: Secondary | ICD-10-CM | POA: Diagnosis not present

## 2016-06-25 DIAGNOSIS — R413 Other amnesia: Secondary | ICD-10-CM | POA: Diagnosis not present

## 2016-07-11 DIAGNOSIS — R972 Elevated prostate specific antigen [PSA]: Secondary | ICD-10-CM | POA: Diagnosis not present

## 2016-07-11 DIAGNOSIS — N401 Enlarged prostate with lower urinary tract symptoms: Secondary | ICD-10-CM | POA: Diagnosis not present

## 2016-07-17 DIAGNOSIS — G3184 Mild cognitive impairment, so stated: Secondary | ICD-10-CM | POA: Diagnosis not present

## 2016-08-14 DIAGNOSIS — G40209 Localization-related (focal) (partial) symptomatic epilepsy and epileptic syndromes with complex partial seizures, not intractable, without status epilepticus: Secondary | ICD-10-CM | POA: Diagnosis not present

## 2016-08-14 DIAGNOSIS — R41844 Frontal lobe and executive function deficit: Secondary | ICD-10-CM | POA: Diagnosis not present

## 2016-08-14 DIAGNOSIS — R413 Other amnesia: Secondary | ICD-10-CM | POA: Diagnosis not present

## 2016-09-11 DIAGNOSIS — Z950 Presence of cardiac pacemaker: Secondary | ICD-10-CM | POA: Diagnosis not present

## 2016-10-16 DIAGNOSIS — M1990 Unspecified osteoarthritis, unspecified site: Secondary | ICD-10-CM | POA: Diagnosis not present

## 2016-10-16 DIAGNOSIS — E785 Hyperlipidemia, unspecified: Secondary | ICD-10-CM | POA: Diagnosis not present

## 2016-10-16 DIAGNOSIS — E559 Vitamin D deficiency, unspecified: Secondary | ICD-10-CM | POA: Diagnosis not present

## 2016-10-16 DIAGNOSIS — I4892 Unspecified atrial flutter: Secondary | ICD-10-CM | POA: Diagnosis not present

## 2016-10-16 DIAGNOSIS — J309 Allergic rhinitis, unspecified: Secondary | ICD-10-CM | POA: Diagnosis not present

## 2016-10-16 DIAGNOSIS — Z23 Encounter for immunization: Secondary | ICD-10-CM | POA: Diagnosis not present

## 2016-10-16 DIAGNOSIS — Z6823 Body mass index (BMI) 23.0-23.9, adult: Secondary | ICD-10-CM | POA: Diagnosis not present

## 2016-10-16 DIAGNOSIS — Z0000 Encounter for general adult medical examination without abnormal findings: Secondary | ICD-10-CM | POA: Diagnosis not present

## 2016-10-16 DIAGNOSIS — M545 Low back pain: Secondary | ICD-10-CM | POA: Diagnosis not present

## 2016-10-16 DIAGNOSIS — Z79899 Other long term (current) drug therapy: Secondary | ICD-10-CM | POA: Diagnosis not present

## 2016-10-16 DIAGNOSIS — J45909 Unspecified asthma, uncomplicated: Secondary | ICD-10-CM | POA: Diagnosis not present

## 2016-10-16 DIAGNOSIS — R739 Hyperglycemia, unspecified: Secondary | ICD-10-CM | POA: Diagnosis not present

## 2016-10-20 DIAGNOSIS — H401132 Primary open-angle glaucoma, bilateral, moderate stage: Secondary | ICD-10-CM | POA: Diagnosis not present

## 2016-12-11 DIAGNOSIS — Z6822 Body mass index (BMI) 22.0-22.9, adult: Secondary | ICD-10-CM | POA: Diagnosis not present

## 2016-12-11 DIAGNOSIS — E785 Hyperlipidemia, unspecified: Secondary | ICD-10-CM | POA: Diagnosis not present

## 2016-12-11 DIAGNOSIS — I480 Paroxysmal atrial fibrillation: Secondary | ICD-10-CM | POA: Diagnosis not present

## 2016-12-11 DIAGNOSIS — Z79899 Other long term (current) drug therapy: Secondary | ICD-10-CM | POA: Diagnosis not present

## 2016-12-11 DIAGNOSIS — Z950 Presence of cardiac pacemaker: Secondary | ICD-10-CM | POA: Diagnosis not present

## 2017-01-07 DIAGNOSIS — R972 Elevated prostate specific antigen [PSA]: Secondary | ICD-10-CM | POA: Diagnosis not present

## 2017-01-07 DIAGNOSIS — N401 Enlarged prostate with lower urinary tract symptoms: Secondary | ICD-10-CM | POA: Diagnosis not present

## 2017-01-14 DIAGNOSIS — M1990 Unspecified osteoarthritis, unspecified site: Secondary | ICD-10-CM | POA: Diagnosis not present

## 2017-01-14 DIAGNOSIS — F418 Other specified anxiety disorders: Secondary | ICD-10-CM | POA: Diagnosis not present

## 2017-01-14 DIAGNOSIS — G40909 Epilepsy, unspecified, not intractable, without status epilepticus: Secondary | ICD-10-CM | POA: Diagnosis not present

## 2017-01-14 DIAGNOSIS — I4892 Unspecified atrial flutter: Secondary | ICD-10-CM | POA: Diagnosis not present

## 2017-01-14 DIAGNOSIS — Z79899 Other long term (current) drug therapy: Secondary | ICD-10-CM | POA: Diagnosis not present

## 2017-01-14 DIAGNOSIS — E785 Hyperlipidemia, unspecified: Secondary | ICD-10-CM | POA: Diagnosis not present

## 2017-01-14 DIAGNOSIS — J309 Allergic rhinitis, unspecified: Secondary | ICD-10-CM | POA: Diagnosis not present

## 2017-01-14 DIAGNOSIS — R2681 Unsteadiness on feet: Secondary | ICD-10-CM | POA: Diagnosis not present

## 2017-01-14 DIAGNOSIS — Z9181 History of falling: Secondary | ICD-10-CM | POA: Diagnosis not present

## 2017-01-14 DIAGNOSIS — Z6824 Body mass index (BMI) 24.0-24.9, adult: Secondary | ICD-10-CM | POA: Diagnosis not present

## 2017-03-26 DIAGNOSIS — R001 Bradycardia, unspecified: Secondary | ICD-10-CM | POA: Diagnosis not present

## 2017-04-07 DIAGNOSIS — H2513 Age-related nuclear cataract, bilateral: Secondary | ICD-10-CM | POA: Diagnosis not present

## 2017-04-07 DIAGNOSIS — H401132 Primary open-angle glaucoma, bilateral, moderate stage: Secondary | ICD-10-CM | POA: Diagnosis not present

## 2017-05-12 DIAGNOSIS — Z23 Encounter for immunization: Secondary | ICD-10-CM | POA: Diagnosis not present

## 2017-05-15 DIAGNOSIS — H409 Unspecified glaucoma: Secondary | ICD-10-CM | POA: Insufficient documentation

## 2017-05-15 DIAGNOSIS — M1990 Unspecified osteoarthritis, unspecified site: Secondary | ICD-10-CM | POA: Insufficient documentation

## 2017-05-15 DIAGNOSIS — E785 Hyperlipidemia, unspecified: Secondary | ICD-10-CM | POA: Insufficient documentation

## 2017-05-15 DIAGNOSIS — R251 Tremor, unspecified: Secondary | ICD-10-CM

## 2017-05-15 DIAGNOSIS — F329 Major depressive disorder, single episode, unspecified: Secondary | ICD-10-CM | POA: Insufficient documentation

## 2017-05-15 DIAGNOSIS — T7840XA Allergy, unspecified, initial encounter: Secondary | ICD-10-CM | POA: Insufficient documentation

## 2017-05-15 DIAGNOSIS — J45909 Unspecified asthma, uncomplicated: Secondary | ICD-10-CM | POA: Insufficient documentation

## 2017-05-15 DIAGNOSIS — H9190 Unspecified hearing loss, unspecified ear: Secondary | ICD-10-CM | POA: Insufficient documentation

## 2017-05-15 DIAGNOSIS — I495 Sick sinus syndrome: Secondary | ICD-10-CM

## 2017-05-15 DIAGNOSIS — F419 Anxiety disorder, unspecified: Secondary | ICD-10-CM | POA: Insufficient documentation

## 2017-05-15 DIAGNOSIS — F32A Depression, unspecified: Secondary | ICD-10-CM | POA: Insufficient documentation

## 2017-05-15 HISTORY — DX: Tremor, unspecified: R25.1

## 2017-05-15 HISTORY — DX: Sick sinus syndrome: I49.5

## 2017-05-26 DIAGNOSIS — J309 Allergic rhinitis, unspecified: Secondary | ICD-10-CM | POA: Diagnosis not present

## 2017-05-26 DIAGNOSIS — E663 Overweight: Secondary | ICD-10-CM | POA: Diagnosis not present

## 2017-05-26 DIAGNOSIS — Z6825 Body mass index (BMI) 25.0-25.9, adult: Secondary | ICD-10-CM | POA: Diagnosis not present

## 2017-05-26 DIAGNOSIS — F418 Other specified anxiety disorders: Secondary | ICD-10-CM | POA: Diagnosis not present

## 2017-05-26 DIAGNOSIS — I4892 Unspecified atrial flutter: Secondary | ICD-10-CM | POA: Diagnosis not present

## 2017-05-26 DIAGNOSIS — Z79899 Other long term (current) drug therapy: Secondary | ICD-10-CM | POA: Diagnosis not present

## 2017-05-26 DIAGNOSIS — E785 Hyperlipidemia, unspecified: Secondary | ICD-10-CM | POA: Diagnosis not present

## 2017-05-26 DIAGNOSIS — I10 Essential (primary) hypertension: Secondary | ICD-10-CM | POA: Diagnosis not present

## 2017-06-09 NOTE — Progress Notes (Signed)
Cardiology Office Note:    Date:  06/10/2017   ID:  Lawrence Braun, DOB [birth year], MRN [id number]  PCP:  Maryella Shivers, MD  Cardiologist:  Shirlee More, MD    Referring MD: Maryella Shivers, MD    ASSESSMENT:    1. PAF (paroxysmal atrial fibrillation) ([place])   2. SSS (sick sinus syndrome) ([place])   3. Atrial flutter, unspecified type ([place])   4. Cardiac pacemaker in situ   5. On amiodarone therapy    PLAN:    In order of problems listed above:  1. Stable continue low-dose amiodarone, recent labs for liver function and thyroid requested from his PCP and although there is no indication of lung toxicity on the chest x-ray for screening. 2. Stable continue current pacemaker management he does not want to transition to device clinic and my practice until I returned [place] 3. Stable no clinical recurrence on device telemetry 4. Stable function, reviewed his last device check with him 5. Stable continue minimum dose no evidence of toxicity   Next appointment: 6 months   Medication Adjustments/Labs and Tests Ordered: Current medicines are reviewed at length with the patient today.  Concerns regarding medicines are outlined above.  No orders of the defined types were placed in this encounter.  No orders of the defined types were placed in this encounter.   Chief Complaint  Patient presents with  . Follow-up    atrial fibrillation    History of Present Illness:    Lawrence Braun is a 71 y.o. male with a hx of skull fracture in 2014 with a subsequent traumatic brain injury (frontal lobes), subdural hematoma and subarachnoid hemorrhage, Paroxysmal Atrial Fibrillation, Dyslipidemia, pacemaker on amiodarone  last seen 6 months ago.overall he continues to improve but still has problems with disequilibrium. He's had no chest pain palpitation syncope TIA pacemaker awareness or side effects of amiodarone such as photosensitivity. Compliance with diet, lifestyle and  medications: yes Past Medical History:  Diagnosis Date  . Allergy   . Anxiety   . Arm weakness 06/01/2013  . Arthritis   . Asthma   . Atrial flutter ([place])   . Balance problem 06/01/2013  . Bradycardia 10/31/2015  . Cardiac pacemaker in situ 02/22/2015  . Depression   . Episode of altered cognition 11/13/2012  . Fracture, skull ([place]) 02/28/2013   Overview:  occiptial, nondisplaced  . Generalized anxiety disorder 01/12/2014  . Glaucoma   . Headache 11/13/2012  . Hematoma 11/06/2012  . Hyperlipidemia   . Loss of hearing    rt ear after fall  . MDD (major depressive disorder), recurrent episode ([place]) 10/05/2013  . On amiodarone therapy 02/22/2015  . Pacemaker reprogramming/check 10/31/2015  . PAF (paroxysmal atrial fibrillation) ([place]) 02/22/2015   Overview:  CHADS2=1  . Partial epilepsy with impairment of consciousness, not intractable ([place]) 11/05/2015  . Sacral fracture ([place]) 11/06/2012   after falling on ice  . SDH (subdural hematoma) ([place]) 11/17/2012  . Seizures ([place]) 05/23/2015  . SSS (sick sinus syndrome) ([place]) 05/15/2017  . Subarachnoid hemorrhage ([place]) 11/06/2012   after falling on ice  . Subdural hematoma ([place]) 11/06/2012   after falling on ice  . TBI (traumatic brain injury) ([place]) 10/05/2013  . Traumatic subarachnoid hemorrhage without loss of consciousness ([place]) 02/28/2013  . Tremor 05/15/2017    Past Surgical History:  Procedure Laterality Date  . INGUINAL HERNIA REPAIR Right 1998  . LUMBAR LAMINECTOMY  1970    Current Medications: Current Meds  Medication Sig  .  amiodarone (PACERONE) 100 MG tablet Take 100 mg by mouth daily.  Marland Kitchen aspirin 81 MG tablet Take 81 mg by mouth daily.  . betaxolol (BETOPTIC-S) 0.5 % ophthalmic suspension Place 1 drop into both eyes daily.  . Calcium Carbonate-Vitamin D (CALCIUM + D PO) Take by mouth daily.  . Cholecalciferol (VITAMIN D PO) Take by mouth daily.  . Coenzyme Q10 (CO Q10) 100 MG CAPS Take by mouth daily.  . fexofenadine (ALLEGRA) 180 MG tablet  Take 180 mg by mouth daily.  . fluticasone (FLONASE) 50 MCG/ACT nasal spray Place into both nostrils daily.  . meloxicam (MOBIC) 7.5 MG tablet Take 7.5 mg by mouth daily.  . Multiple Vitamin (MULTIVITAMIN) tablet Take 1 tablet by mouth daily.  . pravastatin (PRAVACHOL) 20 MG tablet Take 1 tablet by mouth daily.  . Psyllium (METAMUCIL PO) Take by mouth daily.  . sertraline (ZOLOFT) 100 MG tablet Take 100 mg by mouth at bedtime.  Marland Kitchen VIMPAT 100 MG TABS Take 1 tablet by mouth 2 (two) times daily.     Allergies:   Tramadol   Social History   Social History  . Marital status: Married    Spouse name: N/A  . Number of children: N/A  . Years of education: N/A   Social History Main Topics  . Smoking status: Never Smoker  . Smokeless tobacco: Never Used  . Alcohol use Yes     Comment: rare  . Drug use: No  . Sexual activity: Not Asked   Other Topics Concern  . None   Social History Narrative  . None     Family History: The patient's family history includes Colon cancer (age of onset: 65) in his brother; Heart attack in his father; Stroke in his mother. There is no history of Esophageal cancer, Rectal cancer, or Stomach cancer. ROS:   Please see the history of present illness.    All other systems reviewed and are negative.  EKGs/Labs/Other Studies Reviewed:    The following studies were reviewed today:  EKG:  EKG ordered today.  The ekg ordered today demonstrates trial paced rhythm normal QRS including QT duration Device check: Verdon Cummins, PA - 03/26/2017 2:00 PM EDT Interrogation Report:  Implantable Electronic Cardiac Device  Dual Chamber Pacemaker Medtronic Following MD: Ola Spurr ASSESSMENT / PLAN:  NORMAL DEVICE FUNCTION NO SIGNIFICANT ARRHYTHMIAS Continue Routine Remote Monitoring and Annual device clinic interrogation.  1. Bradycardia  2. Cardiac pacemaker in situ  3. Pacemaker MEDTRONIC SUBJECTIVE:  Patient ID: Lawrence Braun is a 71  y.o.male. Pacemaker: Doing relatively well. No syncope, near-syncope, significant palpitations.  ---------------Doing better. Staying busy. TECHNICAL: Adequate Battery Voltage / Expected Longevity ~6 yrs STABLE lead impedances STABLE sensing and pacing characteristics Both thresholds <1V CLINICAL: NO Sustained Ventricular arrhythmias Significant Atrial arrhythmias - Occasional runs of PAT and Sinus Tach. No events of Afib. Most of the events occurred during the time when his wife suffered perforated bowel.  Pacing Mode:MVPR (AAIR-/-DDDR) Base Rate: 60-145 ppm (increased today) Underlying Rhythm: SINUS BRADYCARDIA Heart Rate Profile appear appropriate        Recent Labs: No results found for requested labs within last 8760 hours.  Recent Lipid Panel No results found for: CHOL, TRIG, HDL, CHOLHDL, VLDL, LDLCALC, LDLDIRECT  Physical Exam:    VS:  BP 104/60   Pulse 68   Resp 14   Ht 6\' 3"  (1.905 m)   Wt 189 lb 12.8 oz (86.1 kg)   BMI 23.72 kg/m     Wt  Readings from Last 3 Encounters:  06/10/17 189 lb 12.8 oz (86.1 kg)  03/29/14 195 lb (88.5 kg)  03/15/14 195 lb 12.8 oz (88.8 kg)     GEN:  Well nourished, well developed in no acute distress HEENT: Normal NECK: No JVD; No carotid bruits LYMPHATICS: No lymphadenopathy CARDIAC: RRR, no murmurs, rubs, gallops RESPIRATORY:  Clear to auscultation without rales, wheezing or rhonchi  ABDOMEN: Soft, non-tender, non-distended MUSCULOSKELETAL:  No edema; No deformity  SKIN: Warm and dry NEUROLOGIC:  Alert and oriented x 3 PSYCHIATRIC:  Normal affect    Signed, Shirlee More, MD  06/10/2017 3:51 PM    [facility]

## 2017-06-10 ENCOUNTER — Ambulatory Visit (INDEPENDENT_AMBULATORY_CARE_PROVIDER_SITE_OTHER): Payer: Medicare Other | Admitting: Cardiology

## 2017-06-10 ENCOUNTER — Ambulatory Visit (HOSPITAL_BASED_OUTPATIENT_CLINIC_OR_DEPARTMENT_OTHER)
Admission: RE | Admit: 2017-06-10 | Discharge: 2017-06-10 | Disposition: A | Discharge status: Home / Self Care | Payer: Medicare Other | Source: Ambulatory Visit | Attending: Cardiology | Admitting: Cardiology

## 2017-06-10 ENCOUNTER — Ambulatory Visit: Payer: Medicare Other | Admitting: Cardiology

## 2017-06-10 ENCOUNTER — Encounter: Payer: Self-pay | Admitting: Cardiology

## 2017-06-10 VITALS — BP 104/60 | HR 68 | Resp 14 | Ht 75.0 in | Wt 189.8 lb

## 2017-06-10 DIAGNOSIS — I480 Paroxysmal atrial fibrillation: Secondary | ICD-10-CM

## 2017-06-10 DIAGNOSIS — I4892 Unspecified atrial flutter: Secondary | ICD-10-CM | POA: Diagnosis not present

## 2017-06-10 DIAGNOSIS — Z79899 Other long term (current) drug therapy: Secondary | ICD-10-CM | POA: Diagnosis not present

## 2017-06-10 DIAGNOSIS — I495 Sick sinus syndrome: Secondary | ICD-10-CM

## 2017-06-10 DIAGNOSIS — Z950 Presence of cardiac pacemaker: Secondary | ICD-10-CM | POA: Diagnosis not present

## 2017-06-10 NOTE — Patient Instructions (Signed)
Medication Instructions:  Your physician recommends that you continue on your current medications as directed. Please refer to the Current Medication list given to you today.  Labwork: None  Testing/Procedures: You had an EKG today.  A chest x-ray takes a picture of the organs and structures inside the chest, including the heart, lungs, and blood vessels. This test can show several things, including, whether the heart is enlarges; whether fluid is building up in the lungs; and whether pacemaker / defibrillator leads are still in place.  Follow-Up: Your physician wants you to follow-up in: 6 months. You will receive a reminder letter in the mail two months in advance. If you don't receive a letter, please call our office to schedule the follow-up appointment.  Any Other Special Instructions Will Be Listed Below (If Applicable).     If you need a refill on your cardiac medications before your next appointment, please call your pharmacy.

## 2017-06-23 DIAGNOSIS — G40209 Localization-related (focal) (partial) symptomatic epilepsy and epileptic syndromes with complex partial seizures, not intractable, without status epilepticus: Secondary | ICD-10-CM | POA: Diagnosis not present

## 2017-06-23 DIAGNOSIS — R2689 Other abnormalities of gait and mobility: Secondary | ICD-10-CM | POA: Diagnosis not present

## 2017-06-26 DIAGNOSIS — M62552 Muscle wasting and atrophy, not elsewhere classified, left thigh: Secondary | ICD-10-CM | POA: Diagnosis not present

## 2017-06-26 DIAGNOSIS — R2689 Other abnormalities of gait and mobility: Secondary | ICD-10-CM | POA: Diagnosis not present

## 2017-06-26 DIAGNOSIS — M62551 Muscle wasting and atrophy, not elsewhere classified, right thigh: Secondary | ICD-10-CM | POA: Diagnosis not present

## 2017-06-26 DIAGNOSIS — M6281 Muscle weakness (generalized): Secondary | ICD-10-CM | POA: Diagnosis not present

## 2017-06-29 DIAGNOSIS — M6281 Muscle weakness (generalized): Secondary | ICD-10-CM | POA: Diagnosis not present

## 2017-06-29 DIAGNOSIS — Z950 Presence of cardiac pacemaker: Secondary | ICD-10-CM | POA: Diagnosis not present

## 2017-06-29 DIAGNOSIS — R2689 Other abnormalities of gait and mobility: Secondary | ICD-10-CM | POA: Diagnosis not present

## 2017-06-29 DIAGNOSIS — M62552 Muscle wasting and atrophy, not elsewhere classified, left thigh: Secondary | ICD-10-CM | POA: Diagnosis not present

## 2017-06-29 DIAGNOSIS — M62551 Muscle wasting and atrophy, not elsewhere classified, right thigh: Secondary | ICD-10-CM | POA: Diagnosis not present

## 2017-07-01 DIAGNOSIS — M62552 Muscle wasting and atrophy, not elsewhere classified, left thigh: Secondary | ICD-10-CM | POA: Diagnosis not present

## 2017-07-01 DIAGNOSIS — R2689 Other abnormalities of gait and mobility: Secondary | ICD-10-CM | POA: Diagnosis not present

## 2017-07-01 DIAGNOSIS — M62551 Muscle wasting and atrophy, not elsewhere classified, right thigh: Secondary | ICD-10-CM | POA: Diagnosis not present

## 2017-07-01 DIAGNOSIS — M6281 Muscle weakness (generalized): Secondary | ICD-10-CM | POA: Diagnosis not present

## 2017-07-03 DIAGNOSIS — R2689 Other abnormalities of gait and mobility: Secondary | ICD-10-CM | POA: Diagnosis not present

## 2017-07-03 DIAGNOSIS — M62551 Muscle wasting and atrophy, not elsewhere classified, right thigh: Secondary | ICD-10-CM | POA: Diagnosis not present

## 2017-07-03 DIAGNOSIS — M6281 Muscle weakness (generalized): Secondary | ICD-10-CM | POA: Diagnosis not present

## 2017-07-03 DIAGNOSIS — M62552 Muscle wasting and atrophy, not elsewhere classified, left thigh: Secondary | ICD-10-CM | POA: Diagnosis not present

## 2017-07-06 DIAGNOSIS — M62552 Muscle wasting and atrophy, not elsewhere classified, left thigh: Secondary | ICD-10-CM | POA: Diagnosis not present

## 2017-07-06 DIAGNOSIS — M6281 Muscle weakness (generalized): Secondary | ICD-10-CM | POA: Diagnosis not present

## 2017-07-06 DIAGNOSIS — R2689 Other abnormalities of gait and mobility: Secondary | ICD-10-CM | POA: Diagnosis not present

## 2017-07-06 DIAGNOSIS — M62551 Muscle wasting and atrophy, not elsewhere classified, right thigh: Secondary | ICD-10-CM | POA: Diagnosis not present

## 2017-07-08 DIAGNOSIS — R2689 Other abnormalities of gait and mobility: Secondary | ICD-10-CM | POA: Diagnosis not present

## 2017-07-08 DIAGNOSIS — M62551 Muscle wasting and atrophy, not elsewhere classified, right thigh: Secondary | ICD-10-CM | POA: Diagnosis not present

## 2017-07-08 DIAGNOSIS — M6281 Muscle weakness (generalized): Secondary | ICD-10-CM | POA: Diagnosis not present

## 2017-07-08 DIAGNOSIS — M62552 Muscle wasting and atrophy, not elsewhere classified, left thigh: Secondary | ICD-10-CM | POA: Diagnosis not present

## 2017-07-10 DIAGNOSIS — M6281 Muscle weakness (generalized): Secondary | ICD-10-CM | POA: Diagnosis not present

## 2017-07-10 DIAGNOSIS — R351 Nocturia: Secondary | ICD-10-CM | POA: Diagnosis not present

## 2017-07-10 DIAGNOSIS — R2689 Other abnormalities of gait and mobility: Secondary | ICD-10-CM | POA: Diagnosis not present

## 2017-07-10 DIAGNOSIS — M62552 Muscle wasting and atrophy, not elsewhere classified, left thigh: Secondary | ICD-10-CM | POA: Diagnosis not present

## 2017-07-10 DIAGNOSIS — N401 Enlarged prostate with lower urinary tract symptoms: Secondary | ICD-10-CM | POA: Diagnosis not present

## 2017-07-10 DIAGNOSIS — M62551 Muscle wasting and atrophy, not elsewhere classified, right thigh: Secondary | ICD-10-CM | POA: Diagnosis not present

## 2017-07-13 DIAGNOSIS — M62551 Muscle wasting and atrophy, not elsewhere classified, right thigh: Secondary | ICD-10-CM | POA: Diagnosis not present

## 2017-07-13 DIAGNOSIS — M6281 Muscle weakness (generalized): Secondary | ICD-10-CM | POA: Diagnosis not present

## 2017-07-13 DIAGNOSIS — R2689 Other abnormalities of gait and mobility: Secondary | ICD-10-CM | POA: Diagnosis not present

## 2017-07-13 DIAGNOSIS — M62552 Muscle wasting and atrophy, not elsewhere classified, left thigh: Secondary | ICD-10-CM | POA: Diagnosis not present

## 2017-07-17 DIAGNOSIS — M62551 Muscle wasting and atrophy, not elsewhere classified, right thigh: Secondary | ICD-10-CM | POA: Diagnosis not present

## 2017-07-17 DIAGNOSIS — M6281 Muscle weakness (generalized): Secondary | ICD-10-CM | POA: Diagnosis not present

## 2017-07-17 DIAGNOSIS — R2689 Other abnormalities of gait and mobility: Secondary | ICD-10-CM | POA: Diagnosis not present

## 2017-07-17 DIAGNOSIS — M62552 Muscle wasting and atrophy, not elsewhere classified, left thigh: Secondary | ICD-10-CM | POA: Diagnosis not present

## 2017-07-20 DIAGNOSIS — M62551 Muscle wasting and atrophy, not elsewhere classified, right thigh: Secondary | ICD-10-CM | POA: Diagnosis not present

## 2017-07-20 DIAGNOSIS — M6281 Muscle weakness (generalized): Secondary | ICD-10-CM | POA: Diagnosis not present

## 2017-07-20 DIAGNOSIS — R2689 Other abnormalities of gait and mobility: Secondary | ICD-10-CM | POA: Diagnosis not present

## 2017-07-20 DIAGNOSIS — M62552 Muscle wasting and atrophy, not elsewhere classified, left thigh: Secondary | ICD-10-CM | POA: Diagnosis not present

## 2017-09-28 DIAGNOSIS — Z950 Presence of cardiac pacemaker: Secondary | ICD-10-CM | POA: Diagnosis not present

## 2017-10-08 DIAGNOSIS — H401132 Primary open-angle glaucoma, bilateral, moderate stage: Secondary | ICD-10-CM | POA: Diagnosis not present

## 2017-10-24 IMAGING — CR DG CHEST 2V
2 series · 2 of 2 positions shown · non-contrast
Comparison: None.

CLINICAL DATA: Paroxysmal atrial fibrillation on amiodarone.

EXAM:
CHEST  2 VIEW

[w chest pa]
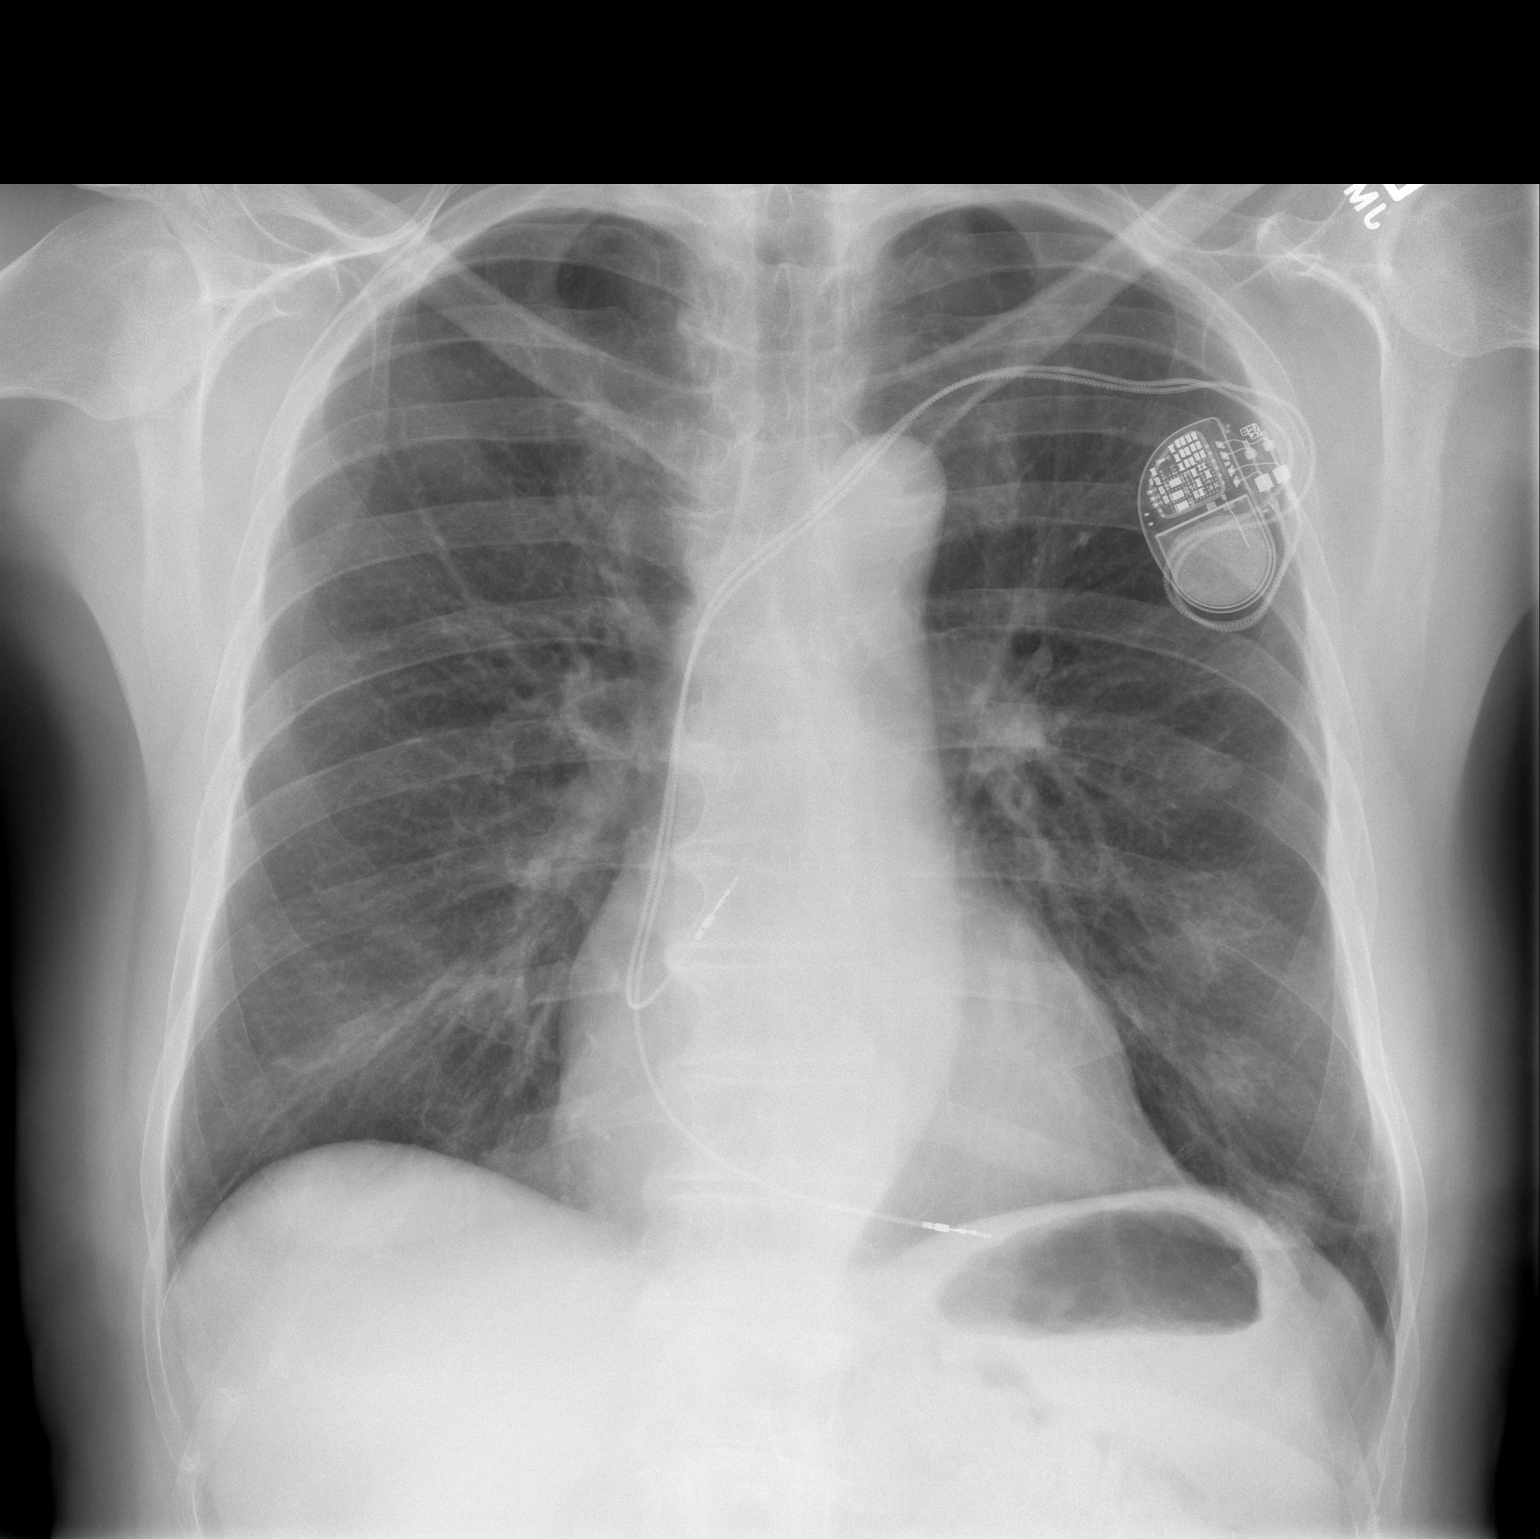

[w chest lat]
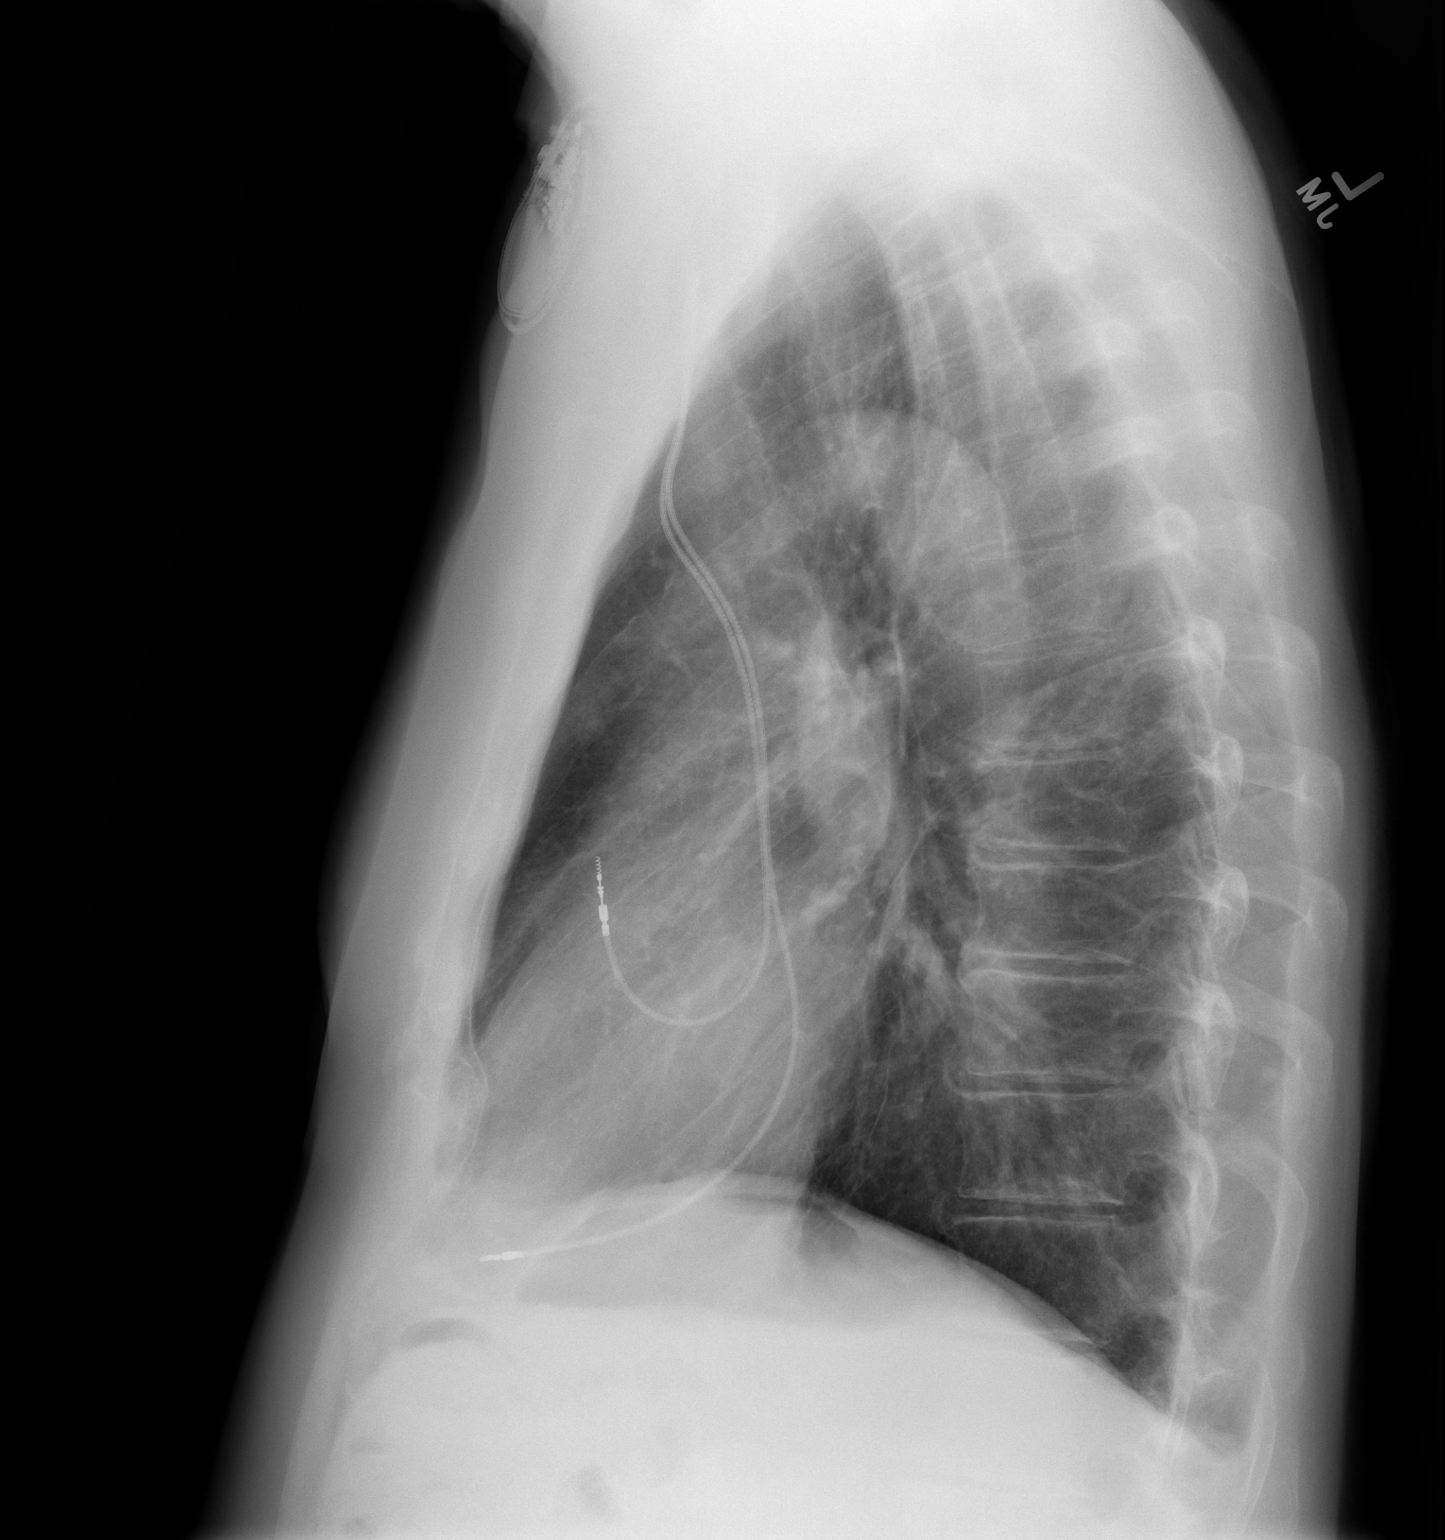

[2 of 2 positions shown; findings below may reference images not displayed]

FINDINGS: The heart size and mediastinal contours are within normal limits.
Left-sided pacemaker apparatus with right atrial and right
ventricular leads are seen. Both lungs are clear. The visualized
skeletal structures are unremarkable.
IMPRESSION: No active cardiopulmonary disease.

## 2017-11-04 ENCOUNTER — Telehealth: Payer: Self-pay | Admitting: Cardiology

## 2017-11-04 MED ORDER — PRAVASTATIN SODIUM 20 MG PO TABS: 20.0000 mg | ORAL_TABLET | Freq: Every day | ORAL | 1 refills | Status: DC

## 2017-11-04 MED ORDER — AMIODARONE HCL 100 MG PO TABS: 100.0000 mg | ORAL_TABLET | Freq: Every day | ORAL | 1 refills | Status: DC

## 2017-11-04 NOTE — Telephone Encounter (Signed)
Left message to return call 

## 2017-11-04 NOTE — Telephone Encounter (Signed)
Please call [facility] regarding Amiodarone and Pravastatin. They are denying these scripts.

## 2017-11-04 NOTE — Telephone Encounter (Signed)
Lawrence Braun that prescription requests were probably being sent to our old office. Advised that refill for amiodarone and pravastatin sent to [facility]. No further questions.

## 2017-11-13 DIAGNOSIS — E785 Hyperlipidemia, unspecified: Secondary | ICD-10-CM | POA: Diagnosis not present

## 2017-11-13 DIAGNOSIS — Z125 Encounter for screening for malignant neoplasm of prostate: Secondary | ICD-10-CM | POA: Diagnosis not present

## 2017-11-13 DIAGNOSIS — Z1331 Encounter for screening for depression: Secondary | ICD-10-CM | POA: Diagnosis not present

## 2017-11-13 DIAGNOSIS — Z9181 History of falling: Secondary | ICD-10-CM | POA: Diagnosis not present

## 2017-11-13 DIAGNOSIS — Z0000 Encounter for general adult medical examination without abnormal findings: Secondary | ICD-10-CM | POA: Diagnosis not present

## 2017-12-09 DIAGNOSIS — R945 Abnormal results of liver function studies: Secondary | ICD-10-CM | POA: Diagnosis not present

## 2017-12-09 DIAGNOSIS — G250 Essential tremor: Secondary | ICD-10-CM | POA: Diagnosis not present

## 2017-12-09 DIAGNOSIS — F418 Other specified anxiety disorders: Secondary | ICD-10-CM | POA: Diagnosis not present

## 2017-12-09 DIAGNOSIS — N400 Enlarged prostate without lower urinary tract symptoms: Secondary | ICD-10-CM | POA: Diagnosis not present

## 2017-12-09 DIAGNOSIS — I4892 Unspecified atrial flutter: Secondary | ICD-10-CM | POA: Diagnosis not present

## 2017-12-09 DIAGNOSIS — Z8781 Personal history of (healed) traumatic fracture: Secondary | ICD-10-CM | POA: Diagnosis not present

## 2017-12-09 DIAGNOSIS — Z6824 Body mass index (BMI) 24.0-24.9, adult: Secondary | ICD-10-CM | POA: Diagnosis not present

## 2017-12-09 DIAGNOSIS — M545 Low back pain: Secondary | ICD-10-CM | POA: Diagnosis not present

## 2017-12-09 DIAGNOSIS — E785 Hyperlipidemia, unspecified: Secondary | ICD-10-CM | POA: Diagnosis not present

## 2017-12-09 DIAGNOSIS — M8589 Other specified disorders of bone density and structure, multiple sites: Secondary | ICD-10-CM | POA: Diagnosis not present

## 2017-12-09 DIAGNOSIS — J309 Allergic rhinitis, unspecified: Secondary | ICD-10-CM | POA: Diagnosis not present

## 2017-12-09 DIAGNOSIS — E663 Overweight: Secondary | ICD-10-CM | POA: Diagnosis not present

## 2017-12-10 ENCOUNTER — Ambulatory Visit: Payer: Medicare Other | Admitting: Cardiology

## 2017-12-10 DIAGNOSIS — Z79899 Other long term (current) drug therapy: Secondary | ICD-10-CM | POA: Diagnosis not present

## 2017-12-10 DIAGNOSIS — E559 Vitamin D deficiency, unspecified: Secondary | ICD-10-CM | POA: Diagnosis not present

## 2017-12-10 DIAGNOSIS — E785 Hyperlipidemia, unspecified: Secondary | ICD-10-CM | POA: Diagnosis not present

## 2017-12-10 NOTE — Progress Notes (Signed)
Cardiology Office Note:    Date:  12/11/2017   ID:  Lawrence Braun, DOB 25-Jan-1946, MRN [id number]  PCP:  Nicholos Johns, MD  Cardiologist:  Shirlee More, MD    Referring MD: Nicholos Johns, MD    ASSESSMENT:    1. PAF (paroxysmal atrial fibrillation) ([place])   2. On amiodarone therapy   3. SSS (sick sinus syndrome) ([facility])    PLAN:    In order of problems listed above:  1. Stable maintaining sinus rhythm continue low-dose amiodarone labs were done yesterday for liver thyroid annual amiodarone level pending he is not anticoagulated with previous intracranial hemorrhage 2. Stable continue low-dose amiodarone no evidence of toxicity 3. Stable good pacemaker function followed in my previous practice and will discuss transition to return to the community in July   Next appointment: 6 months   Medication Adjustments/Labs and Tests Ordered: Current medicines are reviewed at length with the patient today.  Concerns regarding medicines are outlined above.  No orders of the defined types were placed in this encounter.  No orders of the defined types were placed in this encounter.   No chief complaint on file.   History of Present Illness:    Lawrence Braun is a 72 y.o. male with a hx of skull fracture in 2014 with a subsequent traumatic brain injury (frontal lobes), subdural hematoma and subarachnoid hemorrhage, Paroxysmal Atrial Fibrillation, Dyslipidemia, pacemaker on amiodarone last seen 06/10/17.  ASSESSMENT:    06/10/17   1. PAF (paroxysmal atrial fibrillation) ([place])   2. SSS (sick sinus syndrome) ([place])   3. Atrial flutter, unspecified type ([place])   4. Cardiac pacemaker in situ   5. On amiodarone therapy    PLAN:    1. Stable continue low-dose amiodarone, recent labs for liver function and thyroid requested from his PCP and although there is no indication of lung toxicity on the chest x-ray for screening. 2. Stable continue current pacemaker management he does not want  to transition to device clinic and my practice until I returned [place] 3. Stable no clinical recurrence on device telemetry 4. Stable function, reviewed his last device check with him 5. Stable continue minimum dose no evidence of toxicity  Compliance with diet, lifestyle and medications: Yes  He has done well no palpitation syncope pacemaker awareness.  He is device continues to be followed in my previous practice and will plan to transition my return to the community in July. Past Medical History:  Diagnosis Date  . Allergy   . Anxiety   . Arm weakness 06/01/2013  . Arthritis   . Asthma   . Atrial flutter ([place])   . Balance problem 06/01/2013  . Bradycardia 10/31/2015  . Cardiac pacemaker in situ 02/22/2015  . Depression   . Episode of altered cognition 11/13/2012  . Fracture, skull ([place]) 02/28/2013   Overview:  occiptial, nondisplaced  . Generalized anxiety disorder 01/12/2014  . Glaucoma   . Headache 11/13/2012  . Hematoma 11/06/2012  . Hyperlipidemia   . Loss of hearing    rt ear after fall  . MDD (major depressive disorder), recurrent episode ([place]) 10/05/2013  . On amiodarone therapy 02/22/2015  . Pacemaker reprogramming/check 10/31/2015  . PAF (paroxysmal atrial fibrillation) ([place]) 02/22/2015   Overview:  CHADS2=1  . Partial epilepsy with impairment of consciousness, not intractable ([place]) 11/05/2015  . Sacral fracture ([place]) 11/06/2012   after falling on ice  . SDH (subdural hematoma) ([place]) 11/17/2012  . Seizures ([place]) 05/23/2015  . SSS (sick  sinus syndrome) ([place]) 05/15/2017  . Subarachnoid hemorrhage ([place]) 11/06/2012   after falling on ice  . Subdural hematoma ([place]) 11/06/2012   after falling on ice  . TBI (traumatic brain injury) ([place]) 10/05/2013  . Traumatic subarachnoid hemorrhage without loss of consciousness ([place]) 02/28/2013  . Tremor 05/15/2017    Past Surgical History:  Procedure Laterality Date  . INGUINAL HERNIA REPAIR Right 1998  . LUMBAR LAMINECTOMY  1970    Current  Medications: No outpatient medications have been marked as taking for the 12/11/17 encounter (Appointment) with [name], MD.     Allergies:   Tramadol   Social History   Socioeconomic History  . Marital status: Married    Spouse name: Not on file  . Number of children: Not on file  . Years of education: Not on file  . Highest education level: Not on file  Occupational History  . Not on file  Social Needs  . Financial resource strain: Not on file  . Food insecurity:    Worry: Not on file    Inability: Not on file  . Transportation needs:    Medical: Not on file    Non-medical: Not on file  Tobacco Use  . Smoking status: Never Smoker  . Smokeless tobacco: Never Used  Substance and Sexual Activity  . Alcohol use: Yes    Comment: rare  . Drug use: No  . Sexual activity: Not on file  Lifestyle  . Physical activity:    Days per week: Not on file    Minutes per session: Not on file  . Stress: Not on file  Relationships  . Social connections:    Talks on phone: Not on file    Gets together: Not on file    Attends religious service: Not on file    Active member of club or organization: Not on file    Attends meetings of clubs or organizations: Not on file    Relationship status: Not on file  Other Topics Concern  . Not on file  Social History Narrative  . Not on file     Family History: The patient's family history includes Colon cancer (age of onset: 35) in his brother; Heart attack in his father; Stroke in his mother. There is no history of Esophageal cancer, Rectal cancer, or Stomach cancer. ROS:   Please see the history of present illness.    All other systems reviewed and are negative.  EKGs/Labs/Other Studies Reviewed:    The following studies were reviewed today:   Device check: Date of Transmission: January 28,2019 Patient MRN: [id number] Patient Name: Lawrence Braun, [birth year], 72 y.o. Following Provider: Adrian Prows, MD Manufacturer of  Device: Medtronic Type of Device: Dual Chamber Pacemaker See scanned/downloaded PDF report for the model numbers, serial numbers, and date of implant. Presenting Rhythm: AP VS 99 Percentage RV / BiV Pacing: 1%RV Pacing Device Findings: Leads OK; Battery OK; No episodes      Recent Labs: No results found for requested labs within last 8760 hours.  Recent Lipid Panel No results found for: CHOL, TRIG, HDL, CHOLHDL, VLDL, LDLCALC, LDLDIRECT  Physical Exam:    VS:  There were no vitals taken for this visit.    Wt Readings from Last 3 Encounters:  06/10/17 189 lb 12.8 oz (86.1 kg)  03/29/14 195 lb (88.5 kg)  03/15/14 195 lb 12.8 oz (88.8 kg)     GEN:  Well nourished, well developed in no acute distress HEENT: Normal NECK: No  JVD; No carotid bruits LYMPHATICS: No lymphadenopathy CARDIAC: RRR, no murmurs, rubs, gallops RESPIRATORY:  Clear to auscultation without rales, wheezing or rhonchi  ABDOMEN: Soft, non-tender, non-distended MUSCULOSKELETAL:  No edema; No deformity  SKIN: Warm and dry NEUROLOGIC:  Alert and oriented x 3 PSYCHIATRIC:  Normal affect    Signed, Shirlee More, MD  12/11/2017 10:09 AM    [place]

## 2017-12-11 ENCOUNTER — Encounter: Payer: Self-pay | Admitting: Cardiology

## 2017-12-11 ENCOUNTER — Ambulatory Visit (INDEPENDENT_AMBULATORY_CARE_PROVIDER_SITE_OTHER): Payer: Medicare Other | Admitting: Cardiology

## 2017-12-11 VITALS — BP 124/76 | HR 68 | Ht 75.0 in | Wt 191.8 lb

## 2017-12-11 DIAGNOSIS — Z79899 Other long term (current) drug therapy: Secondary | ICD-10-CM | POA: Diagnosis not present

## 2017-12-11 DIAGNOSIS — I495 Sick sinus syndrome: Secondary | ICD-10-CM

## 2017-12-11 DIAGNOSIS — I480 Paroxysmal atrial fibrillation: Secondary | ICD-10-CM | POA: Diagnosis not present

## 2017-12-11 NOTE — Patient Instructions (Signed)
Medication Instructions:  Your physician recommends that you continue on your current medications as directed. Please refer to the Current Medication list given to you today.  Labwork: None  Testing/Procedures: None  Follow-Up: Your physician recommends that you schedule a follow-up appointment in: 6 months  Any Other Special Instructions Will Be Listed Below (If Applicable).     If you need a refill on your cardiac medications before your next appointment, please call your pharmacy.   [facility]  Landa Mullinax A, RN, BSN  

## 2017-12-14 DIAGNOSIS — R2689 Other abnormalities of gait and mobility: Secondary | ICD-10-CM | POA: Diagnosis not present

## 2017-12-14 DIAGNOSIS — G40209 Localization-related (focal) (partial) symptomatic epilepsy and epileptic syndromes with complex partial seizures, not intractable, without status epilepticus: Secondary | ICD-10-CM | POA: Diagnosis not present

## 2017-12-14 DIAGNOSIS — R4189 Other symptoms and signs involving cognitive functions and awareness: Secondary | ICD-10-CM | POA: Diagnosis not present

## 2017-12-14 DIAGNOSIS — Z8679 Personal history of other diseases of the circulatory system: Secondary | ICD-10-CM | POA: Diagnosis not present

## 2017-12-14 DIAGNOSIS — Z87820 Personal history of traumatic brain injury: Secondary | ICD-10-CM | POA: Diagnosis not present

## 2017-12-28 DIAGNOSIS — Z950 Presence of cardiac pacemaker: Secondary | ICD-10-CM | POA: Diagnosis not present

## 2018-02-24 DIAGNOSIS — Z13820 Encounter for screening for osteoporosis: Secondary | ICD-10-CM | POA: Diagnosis not present

## 2018-02-24 DIAGNOSIS — M8589 Other specified disorders of bone density and structure, multiple sites: Secondary | ICD-10-CM | POA: Diagnosis not present

## 2018-03-02 DIAGNOSIS — N401 Enlarged prostate with lower urinary tract symptoms: Secondary | ICD-10-CM | POA: Diagnosis not present

## 2018-03-02 DIAGNOSIS — Z125 Encounter for screening for malignant neoplasm of prostate: Secondary | ICD-10-CM | POA: Diagnosis not present

## 2018-04-08 DIAGNOSIS — Z45018 Encounter for adjustment and management of other part of cardiac pacemaker: Secondary | ICD-10-CM | POA: Diagnosis not present

## 2018-04-12 DIAGNOSIS — H2513 Age-related nuclear cataract, bilateral: Secondary | ICD-10-CM | POA: Diagnosis not present

## 2018-04-12 DIAGNOSIS — H401132 Primary open-angle glaucoma, bilateral, moderate stage: Secondary | ICD-10-CM | POA: Diagnosis not present

## 2018-04-14 DIAGNOSIS — E663 Overweight: Secondary | ICD-10-CM | POA: Diagnosis not present

## 2018-04-14 DIAGNOSIS — Z1339 Encounter for screening examination for other mental health and behavioral disorders: Secondary | ICD-10-CM | POA: Diagnosis not present

## 2018-04-14 DIAGNOSIS — E785 Hyperlipidemia, unspecified: Secondary | ICD-10-CM | POA: Diagnosis not present

## 2018-04-14 DIAGNOSIS — G250 Essential tremor: Secondary | ICD-10-CM | POA: Diagnosis not present

## 2018-04-14 DIAGNOSIS — Z6824 Body mass index (BMI) 24.0-24.9, adult: Secondary | ICD-10-CM | POA: Diagnosis not present

## 2018-04-30 ENCOUNTER — Telehealth: Payer: Self-pay

## 2018-04-30 MED ORDER — PRAVASTATIN SODIUM 20 MG PO TABS: 20.0000 mg | ORAL_TABLET | Freq: Every day | ORAL | 1 refills | Status: DC

## 2018-04-30 NOTE — Telephone Encounter (Signed)
Rx for pravastatin 20mg  one tablet daily sent to pharmacy as requested.

## 2018-06-04 DIAGNOSIS — Z23 Encounter for immunization: Secondary | ICD-10-CM | POA: Diagnosis not present

## 2018-06-15 ENCOUNTER — Ambulatory Visit: Payer: Medicare Other | Admitting: Cardiology

## 2018-06-28 DIAGNOSIS — G40209 Localization-related (focal) (partial) symptomatic epilepsy and epileptic syndromes with complex partial seizures, not intractable, without status epilepticus: Secondary | ICD-10-CM | POA: Diagnosis not present

## 2018-06-28 DIAGNOSIS — R4189 Other symptoms and signs involving cognitive functions and awareness: Secondary | ICD-10-CM | POA: Diagnosis not present

## 2018-06-28 DIAGNOSIS — Z87820 Personal history of traumatic brain injury: Secondary | ICD-10-CM | POA: Diagnosis not present

## 2018-06-29 DIAGNOSIS — G40209 Localization-related (focal) (partial) symptomatic epilepsy and epileptic syndromes with complex partial seizures, not intractable, without status epilepticus: Secondary | ICD-10-CM | POA: Diagnosis not present

## 2018-07-15 DIAGNOSIS — Z6824 Body mass index (BMI) 24.0-24.9, adult: Secondary | ICD-10-CM | POA: Diagnosis not present

## 2018-07-15 DIAGNOSIS — Z79899 Other long term (current) drug therapy: Secondary | ICD-10-CM | POA: Diagnosis not present

## 2018-07-15 DIAGNOSIS — J309 Allergic rhinitis, unspecified: Secondary | ICD-10-CM | POA: Diagnosis not present

## 2018-07-15 DIAGNOSIS — G250 Essential tremor: Secondary | ICD-10-CM | POA: Diagnosis not present

## 2018-07-15 DIAGNOSIS — E785 Hyperlipidemia, unspecified: Secondary | ICD-10-CM | POA: Diagnosis not present

## 2018-07-15 NOTE — Progress Notes (Signed)
Cardiology Office Note:    Date:  07/16/2018   ID:  Lawrence Braun, DOB 1946/08/09, MRN [id number]  PCP:  Nicholos Johns, MD  Cardiologist:  Shirlee More, MD    Referring MD: Nicholos Johns, MD    ASSESSMENT:    1. PAF (paroxysmal atrial fibrillation) ([place])   2. Pacemaker   3. On amiodarone therapy    PLAN:    In order of problems listed above:  1. Stable he is purposely not anticoagulated his previous trauma intracranial hemorrhage will continue low-dose amiodarone low-dose aspirin recent labs are drawn his PCP office to be resulted today or Monday liver function TSH and we added an amiodarone level. 2. Stable he has sick sinus syndrome with symptomatic bradycardia and has a back-up pacemaker is requested transition to device and EP care in our practice and consultation was made. 3. Continue low-dose amiodarone no toxicity labs are done every 6 months in his PCP office with liver function TSH pending   Next appointment: 6 months   Medication Adjustments/Labs and Tests Ordered: Current medicines are reviewed at length with the patient today.  Concerns regarding medicines are outlined above.  Orders Placed This Encounter  Procedures  . Amiodarone level  . Ambulatory referral to Cardiac Electrophysiology  . EKG 12-Lead   Meds ordered this encounter  Medications  . DISCONTD: amiodarone (PACERONE) 200 MG tablet    Sig: Take 1 tablet (200 mg total) by mouth daily.    Dispense:  90 tablet    Refill:  3  . amiodarone (PACERONE) 200 MG tablet    Sig: Take 1 tablet (200 mg total) by mouth daily.    Dispense:  90 tablet    Refill:  3    No chief complaint on file.   History of Present Illness:    Lawrence Braun is a 72 y.o. male with a hx of  skull fracture in 2014 with a subsequent traumatic brain injury (frontal lobes), subdural hematoma and subarachnoid hemorrhage, Paroxysmal Atrial Fibrillation, Dyslipidemia, pacemaker on amiodarone last seen 12/11/2017. Compliance  with diet, lifestyle and medications: Yes  He continues to do well and has had no breakthrough episodes of atrial fibrillation or syncope.  He request to transition pacemaker EP care to our practice from [facility] in [place].  He said no chest pain edema shortness of breath syncope or TIA.  Purposely is not anticoagulated with previous trauma and intracranial hemorrhage Past Medical History:  Diagnosis Date  . Allergy   . Anxiety   . Arm weakness 06/01/2013  . Arthritis   . Asthma   . Atrial flutter ([place])   . Balance problem 06/01/2013  . Bradycardia 10/31/2015  . Cardiac pacemaker in situ 02/22/2015  . Depression   . Episode of altered cognition 11/13/2012  . Fracture, skull ([place]) 02/28/2013   Overview:  occiptial, nondisplaced  . Generalized anxiety disorder 01/12/2014  . Glaucoma   . Headache 11/13/2012  . Hematoma 11/06/2012  . Hyperlipidemia   . Loss of hearing    rt ear after fall  . MDD (major depressive disorder), recurrent episode ([place]) 10/05/2013  . On amiodarone therapy 02/22/2015  . Pacemaker reprogramming/check 10/31/2015  . PAF (paroxysmal atrial fibrillation) ([place]) 02/22/2015   Overview:  CHADS2=1  . Partial epilepsy with impairment of consciousness, not intractable ([place]) 11/05/2015  . Sacral fracture ([place]) 11/06/2012   after falling on ice  . SDH (subdural hematoma) ([place]) 11/17/2012  . Seizures ([place]) 05/23/2015  . SSS (sick sinus syndrome) (  [place]) 05/15/2017  . Subarachnoid hemorrhage ([place]) 11/06/2012   after falling on ice  . Subdural hematoma ([place]) 11/06/2012   after falling on ice  . TBI (traumatic brain injury) ([place]) 10/05/2013  . Traumatic subarachnoid hemorrhage without loss of consciousness ([place]) 02/28/2013  . Tremor 05/15/2017    Past Surgical History:  Procedure Laterality Date  . INGUINAL HERNIA REPAIR Right 1998  . LUMBAR LAMINECTOMY  1970    Current Medications: Current Meds  Medication Sig  . amiodarone (PACERONE) 200 MG tablet Take 1 tablet (200 mg total)  by mouth daily.  Marland Kitchen aspirin 81 MG tablet Take 81 mg by mouth daily.  . betaxolol (BETOPTIC-S) 0.5 % ophthalmic suspension Place 1 drop into both eyes daily.  . Calcium Carbonate-Vitamin D (CALCIUM + D PO) Take by mouth daily.  . Cholecalciferol (VITAMIN D PO) Take by mouth daily.  . Coenzyme Q10 (CO Q10) 100 MG CAPS Take by mouth daily.  Marland Kitchen donepezil (ARICEPT) 5 MG tablet Take 5 mg by mouth daily.  . fexofenadine (ALLEGRA) 180 MG tablet Take 180 mg by mouth daily.  . fluticasone (FLONASE) 50 MCG/ACT nasal spray Place 2 sprays into both nostrils daily.   . meloxicam (MOBIC) 7.5 MG tablet Take 7.5 mg by mouth daily.  . montelukast (SINGULAIR) 10 MG tablet Take 10 mg by mouth daily.  . Multiple Vitamin (MULTIVITAMIN) tablet Take 1 tablet by mouth daily.  . pravastatin (PRAVACHOL) 20 MG tablet Take 1 tablet (20 mg total) by mouth daily.  . Psyllium (METAMUCIL PO) Take by mouth daily.  . sertraline (ZOLOFT) 100 MG tablet Take 100 mg by mouth at bedtime.  Marland Kitchen VIMPAT 100 MG TABS Take 1 tablet by mouth 2 (two) times daily.  . [DISCONTINUED] amiodarone (PACERONE) 200 MG tablet Take 200 mg by mouth daily.  . [DISCONTINUED] amiodarone (PACERONE) 200 MG tablet Take 1 tablet (200 mg total) by mouth daily.     Allergies:   Tramadol   Social History   Socioeconomic History  . Marital status: Married    Spouse name: Not on file  . Number of children: Not on file  . Years of education: Not on file  . Highest education level: Not on file  Occupational History  . Not on file  Social Needs  . Financial resource strain: Not on file  . Food insecurity:    Worry: Not on file    Inability: Not on file  . Transportation needs:    Medical: Not on file    Non-medical: Not on file  Tobacco Use  . Smoking status: Never Smoker  . Smokeless tobacco: Never Used  Substance and Sexual Activity  . Alcohol use: Yes    Comment: rare  . Drug use: No  . Sexual activity: Not on file  Lifestyle  . Physical  activity:    Days per week: Not on file    Minutes per session: Not on file  . Stress: Not on file  Relationships  . Social connections:    Talks on phone: Not on file    Gets together: Not on file    Attends religious service: Not on file    Active member of club or organization: Not on file    Attends meetings of clubs or organizations: Not on file    Relationship status: Not on file  Other Topics Concern  . Not on file  Social History Narrative  . Not on file     Family History: The patient's family history  includes Colon cancer (age of onset: 66) in his brother; Heart attack in his father; Stroke in his mother. There is no history of Esophageal cancer, Rectal cancer, or Stomach cancer. ROS:   Please see the history of present illness.    All other systems reviewed and are negative.  EKGs/Labs/Other Studies Reviewed:    The following studies were reviewed today:  EKG:  EKG ordered today.  The ekg ordered today demonstrates normal dual-chamber pacemaker function atrial paced normal QRS morphology conducted in the AV node  Recent Labs: No results found for requested labs within last 8760 hours.  Recent Lipid Panel No results found for: CHOL, TRIG, HDL, CHOLHDL, VLDL, LDLCALC, LDLDIRECT  Physical Exam:    VS:  BP 110/72 (BP Location: Right Arm, Patient Position: Sitting, Cuff Size: Normal)   Pulse 65   Ht 6\' 3"  (1.905 m)   Wt 192 lb (87.1 kg)   SpO2 96%   BMI 24.00 kg/m     Wt Readings from Last 3 Encounters:  07/16/18 192 lb (87.1 kg)  12/11/17 191 lb 12.8 oz (87 kg)  06/10/17 189 lb 12.8 oz (86.1 kg)     GEN:  Well nourished, well developed in no acute distress HEENT: Normal NECK: No JVD; No carotid bruits LYMPHATICS: No lymphadenopathy CARDIAC: RRR, no murmurs, rubs, gallops RESPIRATORY:  Clear to auscultation without rales, wheezing or rhonchi  ABDOMEN: Soft, non-tender, non-distended MUSCULOSKELETAL:  No edema; No deformity  SKIN: Warm and  dry NEUROLOGIC:  Alert and oriented x 3 PSYCHIATRIC:  Normal affect    Signed, Shirlee More, MD  07/16/2018 11:46 AM    [place]

## 2018-07-16 ENCOUNTER — Ambulatory Visit (INDEPENDENT_AMBULATORY_CARE_PROVIDER_SITE_OTHER): Payer: Medicare Other | Admitting: Cardiology

## 2018-07-16 ENCOUNTER — Encounter: Payer: Self-pay | Admitting: Cardiology

## 2018-07-16 VITALS — BP 110/72 | HR 65 | Ht 75.0 in | Wt 192.0 lb

## 2018-07-16 DIAGNOSIS — I480 Paroxysmal atrial fibrillation: Secondary | ICD-10-CM

## 2018-07-16 DIAGNOSIS — Z950 Presence of cardiac pacemaker: Secondary | ICD-10-CM

## 2018-07-16 DIAGNOSIS — Z79899 Other long term (current) drug therapy: Secondary | ICD-10-CM | POA: Diagnosis not present

## 2018-07-16 MED ORDER — AMIODARONE HCL 200 MG PO TABS: 200.0000 mg | ORAL_TABLET | Freq: Every day | ORAL | 3 refills | Status: DC

## 2018-07-16 NOTE — Patient Instructions (Signed)
Medication Instructions:  Your physician recommends that you continue on your current medications as directed. Please refer to the Current Medication list given to you today.  If you need a refill on your cardiac medications before your next appointment, please call your pharmacy.   Lab work: Your physician recommends that you return for lab work today: amiodarone level.   If you have labs (blood work) drawn today and your tests are completely normal, you will receive your results only by: Marland Kitchen MyChart Message (if you have MyChart) OR . A paper copy in the mail If you have any lab test that is abnormal or we need to change your treatment, we will call you to review the results.  Testing/Procedures: You had an EKG today.   **You have been referred to see our electrophysiologist, Dr. Curt Bears. You will be contacted to schedule this appointment.   Follow-Up: At [facility], you and your health needs are our priority.  As part of our continuing mission to provide you with exceptional heart care, we have created designated Provider Care Teams.  These Care Teams include your primary Cardiologist (physician) and Advanced Practice Providers (APPs -  Physician Assistants and Nurse Practitioners) who all work together to provide you with the care you need, when you need it. You will need a follow up appointment in 6 months.  Please call our office 2 months in advance to schedule this appointment.

## 2018-07-20 LAB — AMIODARONE LEVEL
Amiodarone, Serum: 1.2 ug/mL (ref 1.0–2.5)
Noramiodarone,S: 0.8 ug/mL — ABNORMAL LOW (ref 1.0–2.5)

## 2018-08-04 ENCOUNTER — Encounter: Payer: Self-pay | Admitting: Cardiology

## 2018-08-04 ENCOUNTER — Ambulatory Visit (INDEPENDENT_AMBULATORY_CARE_PROVIDER_SITE_OTHER): Payer: Medicare Other | Admitting: Cardiology

## 2018-08-04 VITALS — BP 124/80 | HR 66 | Ht 75.0 in | Wt 191.6 lb

## 2018-08-04 DIAGNOSIS — I480 Paroxysmal atrial fibrillation: Secondary | ICD-10-CM | POA: Diagnosis not present

## 2018-08-04 DIAGNOSIS — I495 Sick sinus syndrome: Secondary | ICD-10-CM | POA: Diagnosis not present

## 2018-08-04 DIAGNOSIS — I483 Typical atrial flutter: Secondary | ICD-10-CM

## 2018-08-04 NOTE — Patient Instructions (Signed)
Medication Instructions:  Your physician recommends that you continue on your current medications as directed. Please refer to the Current Medication list given to you today.  *If you need a refill on your cardiac medications before your next appointment, please call your pharmacy*  Labwork: None ordered  Testing/Procedures: None ordered  Follow-Up: Remote monitoring is used to monitor your Pacemaker or ICD from home. This monitoring reduces the number of office visits required to check your device to one time per year. It allows [place] to keep an eye on the functioning of your device to ensure it is working properly. You are scheduled for a device check from home on 11/03/18. You may send your transmission at any time that day. If you have a wireless device, the transmission will be sent automatically. After your physician reviews your transmission, you will receive a postcard with your next transmission date.  Your physician wants you to follow-up in: 1 year with Dr. Curt Bears in [place].  You will receive a reminder letter in the mail two months in advance. If you don't receive a letter, please call our office to schedule the follow-up appointment.  Thank you for choosing [facility]!!   Trinidad Curet, RN [phone]  Any Other Special Instructions Will Be Listed Below (If Applicable).

## 2018-08-04 NOTE — Progress Notes (Signed)
Electrophysiology Office Note   Date:  08/04/2018   ID:  Lawrence Braun, DOB 07/26/1946, MRN [id number]  PCP:  Nicholos Johns, MD  Cardiologist:  Bettina Gavia Primary Electrophysiologist:  Lawrence Braun Meredith Leeds, MD    No chief complaint on file.    History of Present Illness: Lawrence Braun is a 72 y.o. male who is being seen today for the evaluation of atrial fibrillation at the request of Lawrence Braun. Presenting today for electrophysiology evaluation.  She has a history of skull fracture in 2014 with a subsequent traumatic brain injury, subdural hematoma, and subarachnoid hemorrhage, paroxysmal atrial fibrillation anemia.  She also has a pacemaker.  She is on amiodarone.    Today, he denies symptoms of palpitations, chest pain, shortness of breath, orthopnea, PND, lower extremity edema, claudication, dizziness, presyncope, syncope, bleeding, or neurologic sequela. The patient is tolerating medications without difficulties.    Past Medical History:  Diagnosis Date  . Allergy   . Anxiety   . Arm weakness 06/01/2013  . Arthritis   . Asthma   . Atrial flutter ([place])   . Balance problem 06/01/2013  . Bradycardia 10/31/2015  . Cardiac pacemaker in situ 02/22/2015  . Depression   . Episode of altered cognition 11/13/2012  . Fracture, skull ([place]) 02/28/2013   Overview:  occiptial, nondisplaced  . Generalized anxiety disorder 01/12/2014  . Glaucoma   . Headache 11/13/2012  . Hematoma 11/06/2012  . Hyperlipidemia   . Loss of hearing    rt ear after fall  . MDD (major depressive disorder), recurrent episode ([place]) 10/05/2013  . On amiodarone therapy 02/22/2015  . Pacemaker reprogramming/check 10/31/2015  . PAF (paroxysmal atrial fibrillation) ([place]) 02/22/2015   Overview:  CHADS2=1  . Partial epilepsy with impairment of consciousness, not intractable ([place]) 11/05/2015  . Sacral fracture ([place]) 11/06/2012   after falling on ice  . SDH (subdural hematoma) ([place]) 11/17/2012  . Seizures ([place]) 05/23/2015  .  SSS (sick sinus syndrome) ([place]) 05/15/2017  . Subarachnoid hemorrhage ([place]) 11/06/2012   after falling on ice  . Subdural hematoma ([place]) 11/06/2012   after falling on ice  . TBI (traumatic brain injury) ([place]) 10/05/2013  . Traumatic subarachnoid hemorrhage without loss of consciousness ([place]) 02/28/2013  . Tremor 05/15/2017   Past Surgical History:  Procedure Laterality Date  . INGUINAL HERNIA REPAIR Right 1998  . LUMBAR LAMINECTOMY  1970     Current Outpatient Medications  Medication Sig Dispense Refill  . amiodarone (PACERONE) 200 MG tablet Take 1 tablet (200 mg total) by mouth daily. 90 tablet 3  . aspirin 81 MG tablet Take 81 mg by mouth daily.    . betaxolol (BETOPTIC-S) 0.5 % ophthalmic suspension Place 1 drop into both eyes daily.    . Calcium Carbonate-Vitamin D (CALCIUM + D PO) Take by mouth daily.    . Cholecalciferol (VITAMIN D PO) Take by mouth daily.    . Coenzyme Q10 (CO Q10) 100 MG CAPS Take by mouth daily.    Marland Kitchen donepezil (ARICEPT) 5 MG tablet Take 5 mg by mouth daily.    . fexofenadine (ALLEGRA) 180 MG tablet Take 180 mg by mouth daily.    . fluticasone (FLONASE) 50 MCG/ACT nasal spray Place 2 sprays into both nostrils daily.     . meloxicam (MOBIC) 7.5 MG tablet Take 7.5 mg by mouth daily.    . montelukast (SINGULAIR) 10 MG tablet Take 10 mg by mouth daily.    . Multiple Vitamin (MULTIVITAMIN) tablet Take 1 tablet  by mouth daily.    . pravastatin (PRAVACHOL) 20 MG tablet Take 1 tablet (20 mg total) by mouth daily. 90 tablet 1  . Psyllium (METAMUCIL PO) Take by mouth daily.    . sertraline (ZOLOFT) 100 MG tablet Take 100 mg by mouth at bedtime.    Marland Kitchen VIMPAT 100 MG TABS Take 1 tablet by mouth 2 (two) times daily.     No current facility-administered medications for this visit.     Allergies:   Tramadol   Social History:  The patient  reports that he has never smoked. He has never used smokeless tobacco. He reports that he drinks alcohol. He reports that he does not use  drugs.   Family History:  The patient's family history includes Colon cancer (age of onset: 3) in his brother; Heart attack in his father; Stroke in his mother.    ROS:  Please see the history of present illness.   Otherwise, review of systems is positive for none.   All other systems are reviewed and negative.    PHYSICAL EXAM: VS:  BP 124/80   Pulse 66   Ht 6\' 3"  (1.905 m)   Wt 191 lb 9.6 oz (86.9 kg)   SpO2 94%   BMI 23.95 kg/m  , BMI Body mass index is 23.95 kg/m. GEN: Well nourished, well developed, in no acute distress  HEENT: normal  Neck: no JVD, carotid bruits, or masses Cardiac: RRR; no murmurs, rubs, or gallops,no edema  Respiratory:  clear to auscultation bilaterally, normal work of breathing GI: soft, nontender, nondistended, + BS MS: no deformity or atrophy  Skin: warm and dry, device pocket is well healed Neuro:  Strength and sensation are intact Psych: euthymic mood, full affect  EKG:  EKG is not ordered today. Personal review of the ekg ordered 07/16/18 shows atrial paced, 5, first-degree AV block  Device interrogation is reviewed today in detail.  See PaceArt for details.   Recent Labs: No results found for requested labs within last 8760 hours.    Lipid Panel  No results found for: CHOL, TRIG, HDL, CHOLHDL, VLDL, LDLCALC, LDLDIRECT   Wt Readings from Last 3 Encounters:  08/04/18 191 lb 9.6 oz (86.9 kg)  07/16/18 192 lb (87.1 kg)  12/11/17 191 lb 12.8 oz (87 kg)      Other studies Reviewed: Additional studies/ records that were reviewed today include: TTE 2014 Review of the above records today demonstrates:  There is no comparison study available. The left ventricular size is normal. No segmental wall motion abnormalities seen in the left ventricle The right ventricle is not well visualized. The left atrium is mildly dilated. The right atrium is moderately dilated. The aortic valve is normal in structure and function. There is mild mitral  regurgitation. There is mild tricuspid regurgitation. Mild pulmonary hypertension. There is no pulmonic valvular regurgitation. Trivial pericardial effusion. Consider cardiac MRI for further assessment of right heart function.   ASSESSMENT AND PLAN:  1.  Paroxysmal atrial fibrillation/atrial flutter: Currently on amiodarone and aspirin.  Likely due to prior brain bleed.  He has had short episodes of atrial flutter over the past few months.  I Amine Adelson discuss this with his primary cardiologist about the possibility of ablation or other antiarrhythmics.  No changes at this time.  This patients CHA2DS2-VASc Score and unadjusted Ischemic Stroke Rate (% per year) is equal to 0.6 % stroke rate/year from a score of 1  Above score calculated as 1 point each if present [CHF,  HTN, DM, Vascular=MI/PAD/Aortic Plaque, Age if 2-74, or Male] Above score calculated as 2 points each if present [Age > 75, or Stroke/TIA/TE]  2.  Sick sinus syndrome: Status post Medtronic pacemaker.  Device functioning appropriately.  No changes.    Current medicines are reviewed at length with the patient today.   The patient does not have concerns regarding his medicines.  The following changes were made today:  none  Labs/ tests ordered today include:  No orders of the defined types were placed in this encounter.    Disposition:   FU with Tora Prunty 1 year  Signed, Annet Manukyan Meredith Leeds, MD  08/04/2018 3:00 PM     [place] [address] [phone] (office) [phone] (fax)

## 2018-09-02 DIAGNOSIS — N401 Enlarged prostate with lower urinary tract symptoms: Secondary | ICD-10-CM | POA: Diagnosis not present

## 2018-09-02 DIAGNOSIS — R351 Nocturia: Secondary | ICD-10-CM | POA: Diagnosis not present

## 2018-09-08 ENCOUNTER — Telehealth: Payer: Self-pay | Admitting: Cardiology

## 2018-09-08 NOTE — Telephone Encounter (Signed)
Saw Dr Curt Bears on 12/04 and there was issues with his pacemaker and they were gonna switch his medicines around-stated they were supposed to forward [place] that information and wants to know if they have

## 2018-09-08 NOTE — Telephone Encounter (Signed)
Informed patient's wife, Butch Penny, per DPR that Dr. Bettina Gavia has reviewed Dr. Macky Lower office note and does not feel that any changes need to be made to patient's medications at this time. Dr. Bettina Gavia recommends that patient follow up as scheduled and to contact our office with any further questions or concerns. Butch Penny verbalized understanding. No further questions.

## 2018-10-05 ENCOUNTER — Other Ambulatory Visit: Payer: Self-pay | Admitting: Cardiology

## 2018-10-14 DIAGNOSIS — H401132 Primary open-angle glaucoma, bilateral, moderate stage: Secondary | ICD-10-CM | POA: Diagnosis not present

## 2018-10-18 DIAGNOSIS — J309 Allergic rhinitis, unspecified: Secondary | ICD-10-CM | POA: Diagnosis not present

## 2018-10-18 DIAGNOSIS — E785 Hyperlipidemia, unspecified: Secondary | ICD-10-CM | POA: Diagnosis not present

## 2018-10-18 DIAGNOSIS — G40909 Epilepsy, unspecified, not intractable, without status epilepticus: Secondary | ICD-10-CM | POA: Diagnosis not present

## 2018-10-18 DIAGNOSIS — G250 Essential tremor: Secondary | ICD-10-CM | POA: Diagnosis not present

## 2018-11-03 ENCOUNTER — Ambulatory Visit (INDEPENDENT_AMBULATORY_CARE_PROVIDER_SITE_OTHER): Payer: Medicare Other | Admitting: *Deleted

## 2018-11-03 DIAGNOSIS — I495 Sick sinus syndrome: Secondary | ICD-10-CM

## 2018-11-03 DIAGNOSIS — I480 Paroxysmal atrial fibrillation: Secondary | ICD-10-CM

## 2018-11-04 LAB — CUP PACEART REMOTE DEVICE CHECK
Brady Statistic AP VP Percent: 1.28 %
Brady Statistic AP VS Percent: 80.81 %
Brady Statistic AS VP Percent: 0.87 %
Brady Statistic AS VS Percent: 17.04 %
Date Time Interrogation Session: 20200304225149
Implantable Lead Implant Date: 20151029
Implantable Lead Location: 753860
Implantable Lead Model: 5076
Implantable Lead Model: 5076
Lead Channel Impedance Value: 304 Ohm
Lead Channel Impedance Value: 361 Ohm
Lead Channel Impedance Value: 456 Ohm
Lead Channel Pacing Threshold Amplitude: 0.75 V
Lead Channel Pacing Threshold Pulse Width: 0.4 ms
Lead Channel Pacing Threshold Pulse Width: 0.4 ms
Lead Channel Sensing Intrinsic Amplitude: 11.125 mV
Lead Channel Sensing Intrinsic Amplitude: 11.125 mV
Lead Channel Sensing Intrinsic Amplitude: 2.25 mV
Lead Channel Setting Pacing Amplitude: 1.5 V
Lead Channel Setting Pacing Pulse Width: 0.4 ms
Lead Channel Setting Sensing Sensitivity: 0.9 mV
MDC IDC LEAD IMPLANT DT: 20151029
MDC IDC LEAD LOCATION: 753859
MDC IDC MSMT BATTERY REMAINING LONGEVITY: 72 mo
MDC IDC MSMT BATTERY VOLTAGE: 3 V
MDC IDC MSMT LEADCHNL RA PACING THRESHOLD AMPLITUDE: 0.5 V
MDC IDC MSMT LEADCHNL RA SENSING INTR AMPL: 2.25 mV
MDC IDC MSMT LEADCHNL RV IMPEDANCE VALUE: 494 Ohm
MDC IDC PG IMPLANT DT: 20151029
MDC IDC SET LEADCHNL RV PACING AMPLITUDE: 2 V
MDC IDC STAT BRADY RA PERCENT PACED: 82.03 %
MDC IDC STAT BRADY RV PERCENT PACED: 2.16 %

## 2018-11-10 NOTE — Progress Notes (Signed)
Remote pacemaker transmission.   

## 2019-02-02 ENCOUNTER — Ambulatory Visit (INDEPENDENT_AMBULATORY_CARE_PROVIDER_SITE_OTHER): Payer: Medicare Other | Admitting: *Deleted

## 2019-02-02 DIAGNOSIS — I495 Sick sinus syndrome: Secondary | ICD-10-CM | POA: Diagnosis not present

## 2019-02-02 LAB — CUP PACEART REMOTE DEVICE CHECK
Battery Remaining Longevity: 63 mo
Battery Voltage: 3 V
Brady Statistic AP VP Percent: 1.12 %
Brady Statistic AP VS Percent: 82.44 %
Brady Statistic AS VP Percent: 0.72 %
Brady Statistic AS VS Percent: 15.72 %
Brady Statistic RA Percent Paced: 83.54 %
Brady Statistic RV Percent Paced: 1.89 %
Date Time Interrogation Session: 20200603134201
Implantable Lead Implant Date: 20151029
Implantable Lead Implant Date: 20151029
Implantable Lead Location: 753859
Implantable Lead Location: 753860
Implantable Lead Model: 5076
Implantable Lead Model: 5076
Implantable Pulse Generator Implant Date: 20151029
Lead Channel Impedance Value: 323 Ohm
Lead Channel Impedance Value: 361 Ohm
Lead Channel Impedance Value: 456 Ohm
Lead Channel Impedance Value: 494 Ohm
Lead Channel Pacing Threshold Amplitude: 0.5 V
Lead Channel Pacing Threshold Amplitude: 0.625 V
Lead Channel Pacing Threshold Pulse Width: 0.4 ms
Lead Channel Pacing Threshold Pulse Width: 0.4 ms
Lead Channel Sensing Intrinsic Amplitude: 10.5 mV
Lead Channel Sensing Intrinsic Amplitude: 10.5 mV
Lead Channel Sensing Intrinsic Amplitude: 2.125 mV
Lead Channel Sensing Intrinsic Amplitude: 2.125 mV
Lead Channel Setting Pacing Amplitude: 1.5 V
Lead Channel Setting Pacing Amplitude: 2 V
Lead Channel Setting Pacing Pulse Width: 0.4 ms
Lead Channel Setting Sensing Sensitivity: 0.9 mV

## 2019-02-10 ENCOUNTER — Encounter: Payer: Self-pay | Admitting: Cardiology

## 2019-02-10 NOTE — Progress Notes (Signed)
Remote pacemaker transmission.   

## 2019-02-17 DIAGNOSIS — N3289 Other specified disorders of bladder: Secondary | ICD-10-CM | POA: Diagnosis not present

## 2019-02-17 DIAGNOSIS — N401 Enlarged prostate with lower urinary tract symptoms: Secondary | ICD-10-CM | POA: Diagnosis not present

## 2019-02-21 DIAGNOSIS — R4189 Other symptoms and signs involving cognitive functions and awareness: Secondary | ICD-10-CM | POA: Diagnosis not present

## 2019-02-21 DIAGNOSIS — Z8679 Personal history of other diseases of the circulatory system: Secondary | ICD-10-CM | POA: Diagnosis not present

## 2019-02-21 DIAGNOSIS — G40209 Localization-related (focal) (partial) symptomatic epilepsy and epileptic syndromes with complex partial seizures, not intractable, without status epilepticus: Secondary | ICD-10-CM | POA: Diagnosis not present

## 2019-03-02 ENCOUNTER — Encounter: Payer: Self-pay | Admitting: Internal Medicine

## 2019-03-07 ENCOUNTER — Telehealth: Payer: Self-pay | Admitting: *Deleted

## 2019-03-07 DIAGNOSIS — J309 Allergic rhinitis, unspecified: Secondary | ICD-10-CM | POA: Diagnosis not present

## 2019-03-07 DIAGNOSIS — Z950 Presence of cardiac pacemaker: Secondary | ICD-10-CM

## 2019-03-07 DIAGNOSIS — E785 Hyperlipidemia, unspecified: Secondary | ICD-10-CM | POA: Diagnosis not present

## 2019-03-07 DIAGNOSIS — R29898 Other symptoms and signs involving the musculoskeletal system: Secondary | ICD-10-CM

## 2019-03-07 DIAGNOSIS — R569 Unspecified convulsions: Secondary | ICD-10-CM

## 2019-03-07 DIAGNOSIS — S065X9A Traumatic subdural hemorrhage with loss of consciousness of unspecified duration, initial encounter: Secondary | ICD-10-CM

## 2019-03-07 DIAGNOSIS — S065XAA Traumatic subdural hemorrhage with loss of consciousness status unknown, initial encounter: Secondary | ICD-10-CM

## 2019-03-07 DIAGNOSIS — I10 Essential (primary) hypertension: Secondary | ICD-10-CM | POA: Diagnosis not present

## 2019-03-07 DIAGNOSIS — S066X0D Traumatic subarachnoid hemorrhage without loss of consciousness, subsequent encounter: Secondary | ICD-10-CM

## 2019-03-07 DIAGNOSIS — I4892 Unspecified atrial flutter: Secondary | ICD-10-CM | POA: Diagnosis not present

## 2019-03-07 DIAGNOSIS — I609 Nontraumatic subarachnoid hemorrhage, unspecified: Secondary | ICD-10-CM

## 2019-03-07 DIAGNOSIS — I480 Paroxysmal atrial fibrillation: Secondary | ICD-10-CM

## 2019-03-07 DIAGNOSIS — I495 Sick sinus syndrome: Secondary | ICD-10-CM

## 2019-03-07 DIAGNOSIS — Z131 Encounter for screening for diabetes mellitus: Secondary | ICD-10-CM

## 2019-03-07 DIAGNOSIS — R001 Bradycardia, unspecified: Secondary | ICD-10-CM

## 2019-03-07 DIAGNOSIS — G40209 Localization-related (focal) (partial) symptomatic epilepsy and epileptic syndromes with complex partial seizures, not intractable, without status epilepticus: Secondary | ICD-10-CM

## 2019-03-07 NOTE — Telephone Encounter (Signed)
Can pt get CBC, CMP , TSH, VIT D, A1C and a lipid panel so that pt won't have to have blood drawn twice. This is blood work Dr. Rica Records wants done.  Please advise.

## 2019-03-08 NOTE — Telephone Encounter (Signed)
Patient's appointment has been switched to an office visit so lab work can be drawn per Dr. Bettina Gavia. Lab ordered have been entered. Patient's wife, Butch Penny, is agreeable and verbalized understanding. She will attend appointment with patient as he has short-term memory loss and needs assistance getting in and out of the office. Informed her for both of them to wear masks. She verbalized understanding. No further questions.

## 2019-03-08 NOTE — Telephone Encounter (Signed)
Yes for labs and office visit

## 2019-03-08 NOTE — Telephone Encounter (Signed)
Patient has an upcoming virtual visit on Monday, 03/14/2019, at 10:20 am. Would you like this to stay virtual or be switched to an office visit so lab work can be collected?

## 2019-03-11 DIAGNOSIS — Z9181 History of falling: Secondary | ICD-10-CM | POA: Diagnosis not present

## 2019-03-11 DIAGNOSIS — Z125 Encounter for screening for malignant neoplasm of prostate: Secondary | ICD-10-CM | POA: Diagnosis not present

## 2019-03-11 DIAGNOSIS — Z1211 Encounter for screening for malignant neoplasm of colon: Secondary | ICD-10-CM | POA: Diagnosis not present

## 2019-03-11 DIAGNOSIS — Z139 Encounter for screening, unspecified: Secondary | ICD-10-CM | POA: Diagnosis not present

## 2019-03-11 DIAGNOSIS — Z1331 Encounter for screening for depression: Secondary | ICD-10-CM | POA: Diagnosis not present

## 2019-03-11 DIAGNOSIS — Z0000 Encounter for general adult medical examination without abnormal findings: Secondary | ICD-10-CM | POA: Diagnosis not present

## 2019-03-12 NOTE — Progress Notes (Signed)
Cardiology Office Note:    Date:  03/14/2019   ID:  Lawrence Braun, DOB Apr 27, 1946, MRN [id number]  PCP:  Nicholos Johns, MD  Cardiologist:  Shirlee More, MD    Referring MD: Nicholos Johns, MD    ASSESSMENT:    1. PAF (paroxysmal atrial fibrillation) ([place])   2. On amiodarone therapy   3. Pacemaker    PLAN:    In order of problems listed above:  1. Atrial fibrillation stable continue amiodarone check liver thyroid for toxicity and level for efficacy. 2. Continue amiodarone no evidence of toxicity 3. Normal pacemaker function await decision from device clinic clinic with the advisability of MR   Next appointment: 6 months   Medication Adjustments/Labs and Tests Ordered: Current medicines are reviewed at length with the patient today.  Concerns regarding medicines are outlined above.  No orders of the defined types were placed in this encounter.  No orders of the defined types were placed in this encounter.   Chief Complaint  Patient presents with  . Follow-up  . Atrial Fibrillation    History of Present Illness:    Lawrence Braun is a 73 y.o. male with a hx of  skull fracture in 2014 with a subsequent traumatic brain injury (frontal lobes), subdural hematoma and subarachnoid hemorrhage, Paroxysmal Atrial Fibrillation, Dyslipidemia, pacemaker on amiodarone last seen by me 07/06/2018.Marland Kitchen Compliance with diet, lifestyle and medications: Yes  Received a note last week about advisability of MRI and his pacemaker.  Detailed questions once monitor device clinic was not completed soon as possible.  He tolerates amiodarone he has had no side effects or recurrent atrial fibrillation.  His device function is normal followed in device clinic and I reviewed his last time download with the patient and wife.  No palpitations syncope chest pain shortness of breath or TIA. Past Medical History:  Diagnosis Date  . Allergy   . Anxiety   . Arm weakness 06/01/2013  . Arthritis   . Asthma    . Atrial flutter ([place])   . Balance problem 06/01/2013  . Bradycardia 10/31/2015  . Cardiac pacemaker in situ 02/22/2015  . Depression   . Episode of altered cognition 11/13/2012  . Fracture, skull ([place]) 02/28/2013   Overview:  occiptial, nondisplaced  . Generalized anxiety disorder 01/12/2014  . Glaucoma   . Headache 11/13/2012  . Hematoma 11/06/2012  . Hyperlipidemia   . Loss of hearing    rt ear after fall  . MDD (major depressive disorder), recurrent episode ([place]) 10/05/2013  . On amiodarone therapy 02/22/2015  . Pacemaker reprogramming/check 10/31/2015  . PAF (paroxysmal atrial fibrillation) ([place]) 02/22/2015   Overview:  CHADS2=1  . Partial epilepsy with impairment of consciousness, not intractable ([place]) 11/05/2015  . Sacral fracture ([place]) 11/06/2012   after falling on ice  . SDH (subdural hematoma) ([place]) 11/17/2012  . Seizures ([place]) 05/23/2015  . SSS (sick sinus syndrome) ([place]) 05/15/2017  . Subarachnoid hemorrhage ([place]) 11/06/2012   after falling on ice  . Subdural hematoma ([place]) 11/06/2012   after falling on ice  . TBI (traumatic brain injury) ([place]) 10/05/2013  . Traumatic subarachnoid hemorrhage without loss of consciousness ([place]) 02/28/2013  . Tremor 05/15/2017    Past Surgical History:  Procedure Laterality Date  . INGUINAL HERNIA REPAIR Right 1998  . LUMBAR LAMINECTOMY  1970    Current Medications: Current Meds  Medication Sig  . amiodarone (PACERONE) 200 MG tablet Take 1 tablet (200 mg total) by mouth daily.  Marland Kitchen aspirin 81  MG tablet Take 81 mg by mouth daily.  . betaxolol (BETOPTIC-S) 0.5 % ophthalmic suspension Place 1 drop into both eyes daily.  . Calcium Carbonate-Vitamin D (CALCIUM + D PO) Take 1 tablet by mouth daily.   . Cholecalciferol (VITAMIN D PO) Take 1 tablet by mouth daily.   . Coenzyme Q10 (CO Q10) 100 MG CAPS Take 1 capsule by mouth daily.   Marland Kitchen donepezil (ARICEPT) 10 MG tablet Take 10 mg by mouth daily.   . fexofenadine (ALLEGRA) 180 MG tablet Take 180 mg by mouth  daily.  . fluticasone (FLONASE) 50 MCG/ACT nasal spray Place 2 sprays into both nostrils daily.   Marland Kitchen LORazepam (ATIVAN) 0.5 MG tablet Take 1 tablet by mouth daily as needed.  . meloxicam (MOBIC) 7.5 MG tablet Take 7.5 mg by mouth daily.  . montelukast (SINGULAIR) 10 MG tablet Take 10 mg by mouth daily.  . Multiple Vitamin (MULTIVITAMIN) tablet Take 1 tablet by mouth daily.  . pravastatin (PRAVACHOL) 20 MG tablet TAKE 1 TABLET DAILY  . Psyllium (METAMUCIL PO) Take by mouth daily.  . sertraline (ZOLOFT) 100 MG tablet Take 100 mg by mouth at bedtime.  . tamsulosin (FLOMAX) 0.4 MG CAPS capsule Take 0.4 mg by mouth daily.  Marland Kitchen VIMPAT 100 MG TABS Take 1 tablet by mouth 2 (two) times daily.     Allergies:   Tramadol   Social History   Socioeconomic History  . Marital status: Married    Spouse name: Not on file  . Number of children: Not on file  . Years of education: Not on file  . Highest education level: Not on file  Occupational History  . Not on file  Social Needs  . Financial resource strain: Not on file  . Food insecurity    Worry: Not on file    Inability: Not on file  . Transportation needs    Medical: Not on file    Non-medical: Not on file  Tobacco Use  . Smoking status: Never Smoker  . Smokeless tobacco: Never Used  Substance and Sexual Activity  . Alcohol use: Yes    Comment: rare  . Drug use: No  . Sexual activity: Not on file  Lifestyle  . Physical activity    Days per week: Not on file    Minutes per session: Not on file  . Stress: Not on file  Relationships  . Social Herbalist on phone: Not on file    Gets together: Not on file    Attends religious service: Not on file    Active member of club or organization: Not on file    Attends meetings of clubs or organizations: Not on file    Relationship status: Not on file  Other Topics Concern  . Not on file  Social History Narrative  . Not on file     Family History: The patient's family history  includes Colon cancer (age of onset: 54) in his brother; Heart attack in his father; Stroke in his mother. There is no history of Esophageal cancer, Rectal cancer, or Stomach cancer. ROS:   Please see the history of present illness.    All other systems reviewed and are negative.  EKGs/Labs/Other Studies Reviewed:    The following studies were reviewed today:  EKG:  EKG ordered today and personally reviewed.  The ekg ordered today demonstrates sinus rhythm normal acute include QT interval first-degree AV block is seen   Ref Range & Units 66mo ago  Amiodarone, Serum 1.0 - 2.5 ug/mL 1.2   Comment: This test was developed and its performance characteristics  determined by [facility]. It has not been cleared or approved  by the Food and Drug Administration.                  Detection Limit = 0.2   Noramiodarone,S 1.0 - 2.5 ug/mL 0.8Low    Comment: This test was developed and its performance characteristics  determined by [facility]. It has not been cleared or approved  by the Food and Drug Administration.                  Detection Limit = 0.2     Recent Labs: No results found for requested labs within last 8760 hours.  Recent Lipid Panel No results found for: CHOL, TRIG, HDL, CHOLHDL, VLDL, LDLCALC, LDLDIRECT  Physical Exam:    VS:  BP 104/72 (BP Location: Right Arm, Patient Position: Sitting, Cuff Size: Normal)   Pulse 76   Temp 98.4 F (36.9 C)   Ht 6\' 3"  (1.905 m)   Wt 186 lb 3.2 oz (84.5 kg)   SpO2 94%   BMI 23.27 kg/m     Wt Readings from Last 3 Encounters:  03/14/19 186 lb 3.2 oz (84.5 kg)  08/04/18 191 lb 9.6 oz (86.9 kg)  07/16/18 192 lb (87.1 kg)     GEN:  Well nourished, well developed in no acute distress HEENT: Normal NECK: No JVD; No carotid bruits LYMPHATICS: No lymphadenopathy CARDIAC: RRR, no murmurs, rubs, gallops RESPIRATORY:  Clear to auscultation without rales, wheezing or rhonchi  ABDOMEN: Soft, non-tender, non-distended  MUSCULOSKELETAL:  No edema; No deformity  SKIN: Warm and dry NEUROLOGIC:  Alert and oriented x 3 PSYCHIATRIC:  Normal affect    Signed, Shirlee More, MD  03/14/2019 11:02 AM    [place]

## 2019-03-14 ENCOUNTER — Ambulatory Visit (INDEPENDENT_AMBULATORY_CARE_PROVIDER_SITE_OTHER): Payer: Medicare Other | Admitting: Cardiology

## 2019-03-14 ENCOUNTER — Other Ambulatory Visit: Payer: Self-pay

## 2019-03-14 ENCOUNTER — Encounter: Payer: Self-pay | Admitting: Cardiology

## 2019-03-14 VITALS — BP 104/72 | HR 76 | Temp 98.4°F | Ht 75.0 in | Wt 186.2 lb

## 2019-03-14 DIAGNOSIS — Z1321 Encounter for screening for nutritional disorder: Secondary | ICD-10-CM | POA: Diagnosis not present

## 2019-03-14 DIAGNOSIS — Z833 Family history of diabetes mellitus: Secondary | ICD-10-CM | POA: Diagnosis not present

## 2019-03-14 DIAGNOSIS — Z8639 Personal history of other endocrine, nutritional and metabolic disease: Secondary | ICD-10-CM | POA: Diagnosis not present

## 2019-03-14 DIAGNOSIS — I480 Paroxysmal atrial fibrillation: Secondary | ICD-10-CM

## 2019-03-14 DIAGNOSIS — Z131 Encounter for screening for diabetes mellitus: Secondary | ICD-10-CM | POA: Diagnosis not present

## 2019-03-14 DIAGNOSIS — Z79899 Other long term (current) drug therapy: Secondary | ICD-10-CM | POA: Diagnosis not present

## 2019-03-14 DIAGNOSIS — R569 Unspecified convulsions: Secondary | ICD-10-CM | POA: Diagnosis not present

## 2019-03-14 DIAGNOSIS — R001 Bradycardia, unspecified: Secondary | ICD-10-CM

## 2019-03-14 DIAGNOSIS — Z950 Presence of cardiac pacemaker: Secondary | ICD-10-CM | POA: Diagnosis not present

## 2019-03-14 DIAGNOSIS — E785 Hyperlipidemia, unspecified: Secondary | ICD-10-CM | POA: Diagnosis not present

## 2019-03-14 MED ORDER — PRAVASTATIN SODIUM 20 MG PO TABS: 20.0000 mg | ORAL_TABLET | Freq: Every day | ORAL | 1 refills | Status: DC

## 2019-03-14 NOTE — Patient Instructions (Signed)
Medication Instructions:  Your physician recommends that you continue on your current medications as directed. Please refer to the Current Medication list given to you today.  If you need a refill on your cardiac medications before your next appointment, please call your pharmacy.   Lab work: Your physician recommends that you return for lab work today: CMP, TSH, Hemoglobin A1c, vitamin D, lipid panel, amiodarone level, CBC.   If you have labs (blood work) drawn today and your tests are completely normal, you will receive your results only by: Marland Kitchen MyChart Message (if you have MyChart) OR . A paper copy in the mail If you have any lab test that is abnormal or we need to change your treatment, we will call you to review the results.  Testing/Procedures: You had an EKG today.   Follow-Up: At [facility], you and your health needs are our priority.  As part of our continuing mission to provide you with exceptional heart care, we have created designated Provider Care Teams.  These Care Teams include your primary Cardiologist (physician) and Advanced Practice Providers (APPs -  Physician Assistants and Nurse Practitioners) who all work together to provide you with the care you need, when you need it. You will need a follow up appointment in 6 months.  Please call our office 2 months in advance to schedule this appointment.

## 2019-03-15 LAB — COMPREHENSIVE METABOLIC PANEL
ALT: 23 IU/L (ref 0–44)
AST: 20 IU/L (ref 0–40)
Albumin/Globulin Ratio: 1.6 (ref 1.2–2.2)
Albumin: 4.2 g/dL (ref 3.7–4.7)
Alkaline Phosphatase: 102 IU/L (ref 39–117)
BUN/Creatinine Ratio: 18 (ref 10–24)
BUN: 18 mg/dL (ref 8–27)
Bilirubin Total: 0.4 mg/dL (ref 0.0–1.2)
CO2: 24 mmol/L (ref 20–29)
Calcium: 9.4 mg/dL (ref 8.6–10.2)
Chloride: 105 mmol/L (ref 96–106)
Creatinine, Ser: 1 mg/dL (ref 0.76–1.27)
GFR calc Af Amer: 85 mL/min/{1.73_m2} (ref >59)
GFR calc non Af Amer: 73 mL/min/{1.73_m2} (ref >59)
Globulin, Total: 2.6 g/dL (ref 1.5–4.5)
Glucose: 102 mg/dL — ABNORMAL HIGH (ref 65–99)
Potassium: 4.9 mmol/L (ref 3.5–5.2)
Sodium: 145 mmol/L — ABNORMAL HIGH (ref 134–144)
Total Protein: 6.8 g/dL (ref 6.0–8.5)

## 2019-03-15 LAB — LIPID PANEL
Chol/HDL Ratio: 3.2 ratio (ref 0.0–5.0)
Cholesterol, Total: 188 mg/dL (ref 100–199)
HDL: 58 mg/dL (ref >39)
LDL Calculated: 113 mg/dL — ABNORMAL HIGH (ref 0–99)
Triglycerides: 87 mg/dL (ref 0–149)
VLDL Cholesterol Cal: 17 mg/dL (ref 5–40)

## 2019-03-15 LAB — CBC
Hematocrit: 48.5 % (ref 37.5–51.0)
Hemoglobin: 16.3 g/dL (ref 13.0–17.7)
MCH: 30.9 pg (ref 26.6–33.0)
MCHC: 33.6 g/dL (ref 31.5–35.7)
MCV: 92 fL (ref 79–97)
Platelets: 221 10*3/uL (ref 150–450)
RBC: 5.27 x10E6/uL (ref 4.14–5.80)
RDW: 12.5 % (ref 11.6–15.4)
WBC: 6.6 10*3/uL (ref 3.4–10.8)

## 2019-03-15 LAB — HEMOGLOBIN A1C
Est. average glucose Bld gHb Est-mCnc: 111 mg/dL
Hgb A1c MFr Bld: 5.5 % (ref 4.8–5.6)

## 2019-03-15 LAB — VITAMIN D 25 HYDROXY (VIT D DEFICIENCY, FRACTURES): Vit D, 25-Hydroxy: 60.6 ng/mL (ref 30.0–100.0)

## 2019-03-15 LAB — TSH: TSH: 2.69 u[IU]/mL (ref 0.450–4.500)

## 2019-03-16 LAB — AMIODARONE LEVEL
Amiodarone, Serum: 1.2 ug/mL (ref 1.0–2.5)
Noramiodarone,S: 0.9 ug/mL — ABNORMAL LOW (ref 1.0–2.5)

## 2019-03-23 ENCOUNTER — Telehealth: Payer: Self-pay | Admitting: Cardiology

## 2019-03-23 NOTE — Telephone Encounter (Signed)
New Message  Patient's wife calling in with concerns about paperwork not being received by [facility]. Patient states that because the patient has a pacemaker, they have to have paperwork filled out that give instructions and description of the pacemaker and how to put it in safe mode so that the patient can have the MRI performed. Patient states that paperwork was initially sent to Dr. Bettina Gavia, but on 03/14/19 the paperwork was faxed over to the device clinic to be filled out and faxed back. Patient's wife also states that this paperwork not being turned in is holding up the process of getting the MRI done and determining whether or not the patient is having or have had a stroke. Please give patient a call to discuss.    MRI being performed by: [facility] [phone]

## 2019-03-23 NOTE — Telephone Encounter (Signed)
I called the pt and let her know I will call [facility] to get the fax number to Resend the fax. We filled out the paperwork for Lawrence Braun 03-10-2019.   I called [facility] and got the fax number. I did resend the fax to them and got a confirmation from the printer that it did go thru.

## 2019-04-12 DIAGNOSIS — Z8679 Personal history of other diseases of the circulatory system: Secondary | ICD-10-CM | POA: Diagnosis not present

## 2019-04-12 DIAGNOSIS — R4189 Other symptoms and signs involving cognitive functions and awareness: Secondary | ICD-10-CM | POA: Diagnosis not present

## 2019-04-12 DIAGNOSIS — G40209 Localization-related (focal) (partial) symptomatic epilepsy and epileptic syndromes with complex partial seizures, not intractable, without status epilepticus: Secondary | ICD-10-CM | POA: Diagnosis not present

## 2019-04-12 DIAGNOSIS — R413 Other amnesia: Secondary | ICD-10-CM | POA: Diagnosis not present

## 2019-04-12 DIAGNOSIS — G9389 Other specified disorders of brain: Secondary | ICD-10-CM | POA: Diagnosis not present

## 2019-04-14 DIAGNOSIS — H401132 Primary open-angle glaucoma, bilateral, moderate stage: Secondary | ICD-10-CM | POA: Diagnosis not present

## 2019-04-14 DIAGNOSIS — H2513 Age-related nuclear cataract, bilateral: Secondary | ICD-10-CM | POA: Diagnosis not present

## 2019-05-04 ENCOUNTER — Ambulatory Visit (INDEPENDENT_AMBULATORY_CARE_PROVIDER_SITE_OTHER): Payer: Medicare Other | Admitting: *Deleted

## 2019-05-04 DIAGNOSIS — I495 Sick sinus syndrome: Secondary | ICD-10-CM

## 2019-05-04 DIAGNOSIS — I480 Paroxysmal atrial fibrillation: Secondary | ICD-10-CM

## 2019-05-05 LAB — CUP PACEART REMOTE DEVICE CHECK
Battery Remaining Longevity: 65 mo
Battery Voltage: 3 V
Brady Statistic AP VP Percent: 1.36 %
Brady Statistic AP VS Percent: 83.07 %
Brady Statistic AS VP Percent: 0.06 %
Brady Statistic AS VS Percent: 15.51 %
Brady Statistic RA Percent Paced: 84.45 %
Brady Statistic RV Percent Paced: 1.48 %
Date Time Interrogation Session: 20200902174213
Implantable Lead Implant Date: 20151029
Implantable Lead Implant Date: 20151029
Implantable Lead Location: 753859
Implantable Lead Location: 753860
Implantable Lead Model: 5076
Implantable Lead Model: 5076
Implantable Pulse Generator Implant Date: 20151029
Lead Channel Impedance Value: 323 Ohm
Lead Channel Impedance Value: 361 Ohm
Lead Channel Impedance Value: 475 Ohm
Lead Channel Impedance Value: 513 Ohm
Lead Channel Pacing Threshold Amplitude: 0.625 V
Lead Channel Pacing Threshold Amplitude: 0.625 V
Lead Channel Pacing Threshold Pulse Width: 0.4 ms
Lead Channel Pacing Threshold Pulse Width: 0.4 ms
Lead Channel Sensing Intrinsic Amplitude: 10.875 mV
Lead Channel Sensing Intrinsic Amplitude: 2.125 mV
Lead Channel Setting Pacing Amplitude: 1.5 V
Lead Channel Setting Pacing Amplitude: 2 V
Lead Channel Setting Pacing Pulse Width: 0.4 ms
Lead Channel Setting Sensing Sensitivity: 0.9 mV

## 2019-05-20 NOTE — Progress Notes (Signed)
Remote pacemaker transmission.   

## 2019-06-07 DIAGNOSIS — G40909 Epilepsy, unspecified, not intractable, without status epilepticus: Secondary | ICD-10-CM | POA: Diagnosis not present

## 2019-06-07 DIAGNOSIS — E559 Vitamin D deficiency, unspecified: Secondary | ICD-10-CM | POA: Diagnosis not present

## 2019-06-07 DIAGNOSIS — J309 Allergic rhinitis, unspecified: Secondary | ICD-10-CM | POA: Diagnosis not present

## 2019-06-07 DIAGNOSIS — E785 Hyperlipidemia, unspecified: Secondary | ICD-10-CM | POA: Diagnosis not present

## 2019-06-07 DIAGNOSIS — Z79899 Other long term (current) drug therapy: Secondary | ICD-10-CM | POA: Diagnosis not present

## 2019-06-07 DIAGNOSIS — G250 Essential tremor: Secondary | ICD-10-CM | POA: Diagnosis not present

## 2019-06-16 ENCOUNTER — Other Ambulatory Visit: Payer: Self-pay | Admitting: Cardiology

## 2019-06-17 DIAGNOSIS — Z79899 Other long term (current) drug therapy: Secondary | ICD-10-CM | POA: Diagnosis not present

## 2019-06-22 DIAGNOSIS — G4700 Insomnia, unspecified: Secondary | ICD-10-CM | POA: Diagnosis not present

## 2019-06-22 DIAGNOSIS — R4189 Other symptoms and signs involving cognitive functions and awareness: Secondary | ICD-10-CM | POA: Diagnosis not present

## 2019-06-22 DIAGNOSIS — G40209 Localization-related (focal) (partial) symptomatic epilepsy and epileptic syndromes with complex partial seizures, not intractable, without status epilepticus: Secondary | ICD-10-CM | POA: Diagnosis not present

## 2019-07-07 DIAGNOSIS — H903 Sensorineural hearing loss, bilateral: Secondary | ICD-10-CM | POA: Diagnosis not present

## 2019-08-03 ENCOUNTER — Ambulatory Visit (INDEPENDENT_AMBULATORY_CARE_PROVIDER_SITE_OTHER): Payer: Medicare Other | Admitting: *Deleted

## 2019-08-03 DIAGNOSIS — R001 Bradycardia, unspecified: Secondary | ICD-10-CM | POA: Diagnosis not present

## 2019-08-04 LAB — CUP PACEART REMOTE DEVICE CHECK
Battery Remaining Longevity: 60 mo
Battery Voltage: 3 V
Brady Statistic AP VP Percent: 1.19 %
Brady Statistic AP VS Percent: 76.86 %
Brady Statistic AS VP Percent: 0.29 %
Brady Statistic AS VS Percent: 21.66 %
Brady Statistic RA Percent Paced: 78.04 %
Brady Statistic RV Percent Paced: 1.53 %
Date Time Interrogation Session: 20201202182254
Implantable Lead Implant Date: 20151029
Implantable Lead Implant Date: 20151029
Implantable Lead Location: 753859
Implantable Lead Location: 753860
Implantable Lead Model: 5076
Implantable Lead Model: 5076
Implantable Pulse Generator Implant Date: 20151029
Lead Channel Impedance Value: 304 Ohm
Lead Channel Impedance Value: 361 Ohm
Lead Channel Impedance Value: 437 Ohm
Lead Channel Impedance Value: 475 Ohm
Lead Channel Pacing Threshold Amplitude: 0.5 V
Lead Channel Pacing Threshold Amplitude: 0.625 V
Lead Channel Pacing Threshold Pulse Width: 0.4 ms
Lead Channel Pacing Threshold Pulse Width: 0.4 ms
Lead Channel Sensing Intrinsic Amplitude: 2 mV
Lead Channel Sensing Intrinsic Amplitude: 2 mV
Lead Channel Sensing Intrinsic Amplitude: 9.5 mV
Lead Channel Sensing Intrinsic Amplitude: 9.5 mV
Lead Channel Setting Pacing Amplitude: 1.5 V
Lead Channel Setting Pacing Amplitude: 2 V
Lead Channel Setting Pacing Pulse Width: 0.4 ms
Lead Channel Setting Sensing Sensitivity: 0.9 mV

## 2019-08-08 ENCOUNTER — Other Ambulatory Visit: Payer: Self-pay

## 2019-08-08 ENCOUNTER — Ambulatory Visit (INDEPENDENT_AMBULATORY_CARE_PROVIDER_SITE_OTHER): Payer: Medicare Other | Admitting: Cardiology

## 2019-08-08 ENCOUNTER — Encounter: Payer: Self-pay | Admitting: Cardiology

## 2019-08-08 VITALS — BP 134/76 | HR 64 | Ht 75.0 in | Wt 194.0 lb

## 2019-08-08 DIAGNOSIS — Z950 Presence of cardiac pacemaker: Secondary | ICD-10-CM

## 2019-08-08 DIAGNOSIS — I495 Sick sinus syndrome: Secondary | ICD-10-CM

## 2019-08-08 DIAGNOSIS — I480 Paroxysmal atrial fibrillation: Secondary | ICD-10-CM

## 2019-08-08 NOTE — Progress Notes (Signed)
Electrophysiology Office Note   Date:  08/08/2019   ID:  Lawrence Braun, DOB Jan 03, 1946, MRN [id number]  PCP:  Nicholos Johns, MD  Cardiologist:  Bettina Gavia Primary Electrophysiologist:  Cormick Moss Meredith Leeds, MD    No chief complaint on file.    History of Present Illness: Lawrence Braun is a 73 y.o. male who is being seen today for the evaluation of atrial fibrillation at the request of Shirlee More. Presenting today for electrophysiology evaluation.  He has a history of skull fracture in 2014 with a subsequent traumatic brain injury, subdural hematoma, and subarachnoid hemorrhage, paroxysmal atrial fibrillation anemia.  She also has a pacemaker.  She is on amiodarone.  Today, denies symptoms of palpitations, chest pain, shortness of breath, orthopnea, PND, lower extremity edema, claudication, dizziness, presyncope, syncope, bleeding, or neurologic sequela. The patient is tolerating medications without difficulties.  Overall he is doing well.  He has no chest pain or shortness of breath.  He continues to try and get exercise, though this is limited as he is wary about going to the gym.   Past Medical History:  Diagnosis Date  . Allergy   . Anxiety   . Arm weakness 06/01/2013  . Arthritis   . Asthma   . Atrial flutter ([place])   . Balance problem 06/01/2013  . Bradycardia 10/31/2015  . Cardiac pacemaker in situ 02/22/2015  . Depression   . Episode of altered cognition 11/13/2012  . Fracture, skull ([place]) 02/28/2013   Overview:  occiptial, nondisplaced  . Generalized anxiety disorder 01/12/2014  . Glaucoma   . Headache 11/13/2012  . Hematoma 11/06/2012  . Hyperlipidemia   . Loss of hearing    rt ear after fall  . MDD (major depressive disorder), recurrent episode ([place]) 10/05/2013  . On amiodarone therapy 02/22/2015  . Pacemaker reprogramming/check 10/31/2015  . PAF (paroxysmal atrial fibrillation) ([place]) 02/22/2015   Overview:  CHADS2=1  . Partial epilepsy with impairment of consciousness,  not intractable ([place]) 11/05/2015  . Sacral fracture ([place]) 11/06/2012   after falling on ice  . SDH (subdural hematoma) ([place]) 11/17/2012  . Seizures ([place]) 05/23/2015  . SSS (sick sinus syndrome) ([place]) 05/15/2017  . Subarachnoid hemorrhage ([place]) 11/06/2012   after falling on ice  . Subdural hematoma ([place]) 11/06/2012   after falling on ice  . TBI (traumatic brain injury) ([place]) 10/05/2013  . Traumatic subarachnoid hemorrhage without loss of consciousness ([place]) 02/28/2013  . Tremor 05/15/2017   Past Surgical History:  Procedure Laterality Date  . INGUINAL HERNIA REPAIR Right 1998  . LUMBAR LAMINECTOMY  1970     Current Outpatient Medications  Medication Sig Dispense Refill  . amiodarone (PACERONE) 200 MG tablet TAKE 1 TABLET DAILY 90 tablet 1  . aspirin 81 MG tablet Take 81 mg by mouth daily.    . betaxolol (BETOPTIC-S) 0.5 % ophthalmic suspension Place 1 drop into both eyes daily.    . Calcium Carbonate-Vitamin D (CALCIUM + D PO) Take 1 tablet by mouth daily.     . Cholecalciferol (VITAMIN D PO) Take 1 tablet by mouth daily.     . Coenzyme Q10 (CO Q10) 100 MG CAPS Take 1 capsule by mouth daily.     Marland Kitchen donepezil (ARICEPT) 10 MG tablet Take 10 mg by mouth daily.     . fexofenadine (ALLEGRA) 180 MG tablet Take 180 mg by mouth daily.    . fluticasone (FLONASE) 50 MCG/ACT nasal spray Place 2 sprays into both nostrils daily.     Marland Kitchen  LORazepam (ATIVAN) 0.5 MG tablet Take 1 tablet by mouth daily as needed.    . meloxicam (MOBIC) 7.5 MG tablet Take 7.5 mg by mouth daily.    . montelukast (SINGULAIR) 10 MG tablet Take 10 mg by mouth daily.    . Multiple Vitamin (MULTIVITAMIN) tablet Take 1 tablet by mouth daily.    . pravastatin (PRAVACHOL) 20 MG tablet Take 1 tablet (20 mg total) by mouth daily. 90 tablet 1  . Psyllium (METAMUCIL PO) Take by mouth daily.    . sertraline (ZOLOFT) 100 MG tablet Take 100 mg by mouth at bedtime.    . tamsulosin (FLOMAX) 0.4 MG CAPS capsule Take 0.4 mg by mouth daily.    Marland Kitchen  VIMPAT 100 MG TABS Take 1 tablet by mouth 2 (two) times daily.     No current facility-administered medications for this visit.     Allergies:   Tramadol   Social History:  The patient  reports that he has never smoked. He has never used smokeless tobacco. He reports current alcohol use. He reports that he does not use drugs.   Family History:  The patient's family history includes Colon cancer (age of onset: 60) in his brother; Heart attack in his father; Stroke in his mother.    ROS:  Please see the history of present illness.   Otherwise, review of systems is positive for none.   All other systems are reviewed and negative.   PHYSICAL EXAM: VS:  BP 134/76   Pulse 64   Ht 6\' 3"  (1.905 m)   Wt 194 lb (88 kg)   SpO2 98%   BMI 24.25 kg/m  , BMI Body mass index is 24.25 kg/m. GEN: Well nourished, well developed, in no acute distress  HEENT: normal  Neck: no JVD, carotid bruits, or masses Cardiac: RRR; no murmurs, rubs, or gallops,no edema  Respiratory:  clear to auscultation bilaterally, normal work of breathing GI: soft, nontender, nondistended, + BS MS: no deformity or atrophy  Skin: warm and dry, device site well healed Neuro:  Strength and sensation are intact Psych: euthymic mood, full affect  EKG:  EKG is ordered today. Personal review of the ekg ordered shows  A paced, 1dAVB  Personal review of the device interrogation today. Results in [place]: 03/14/2019: ALT 23; BUN 18; Creatinine, Ser 1.00; Hemoglobin 16.3; Platelets 221; Potassium 4.9; Sodium 145; TSH 2.690    Lipid Panel     Component Value Date/Time   CHOL 188 03/14/2019 1133   TRIG 87 03/14/2019 1133   HDL 58 03/14/2019 1133   CHOLHDL 3.2 03/14/2019 1133   LDLCALC 113 (H) 03/14/2019 1133     Wt Readings from Last 3 Encounters:  08/08/19 194 lb (88 kg)  03/14/19 186 lb 3.2 oz (84.5 kg)  08/04/18 191 lb 9.6 oz (86.9 kg)      Other studies Reviewed: Additional studies/ records  that were reviewed today include: TTE 2014 Review of the above records today demonstrates:  There is no comparison study available. The left ventricular size is normal. No segmental wall motion abnormalities seen in the left ventricle The right ventricle is not well visualized. The left atrium is mildly dilated. The right atrium is moderately dilated. The aortic valve is normal in structure and function. There is mild mitral regurgitation. There is mild tricuspid regurgitation. Mild pulmonary hypertension. There is no pulmonic valvular regurgitation. Trivial pericardial effusion. Consider cardiac MRI for further assessment of right heart function.  ASSESSMENT AND PLAN:  1.  Paroxysmal atrial fibrillation/atrial flutter: Currently on aspirin and amiodarone.  Has had a prior bleed in the brain.  He has had minimal episodes of atrial fibrillation on device interrogation.  I have told both him and his wife that if he has more, that he may benefit from anticoagulation.  This patients CHA2DS2-VASc Score and unadjusted Ischemic Stroke Rate (% per year) is equal to 2.2 % stroke rate/year from a score of 2  Above score calculated as 1 point each if present CHF, HTN, DM, Vascular=MI/PAD/Aortic Plaque, Age if 35-74, or Male Above score calculated as 2 points each if present age > 80, or Stroke/TIA/TE   2.  Sick sinus syndrome: Status post Medtronic dual-chamber pacemaker.  Device functioning appropriately.  No changes.    Current medicines are reviewed at length with the patient today.   The patient does not have concerns regarding his medicines.  The following changes were made today: None  Labs/ tests ordered today include:  Orders Placed This Encounter  Procedures  . EKG 12-Lead     Disposition:   FU with [facility] 1 year  Signed, Julieann Drummonds Meredith Leeds, MD  08/08/2019 11:22 AM     [facility] [address] [place] [address] [phone]  (office) [phone] (fax)

## 2019-08-08 NOTE — Patient Instructions (Signed)
Medication Instructions:  Your physician recommends that you continue on your current medications as directed. Please refer to the Current Medication list given to you today.  *If you need a refill on your cardiac medications before your next appointment, please call your pharmacy*  Labwork: None ordered If you have labs (blood work) drawn today and your tests are completely normal, you will receive your results only by:  [place] (if you have MyChart) OR  A paper copy in the mail If you have any lab test that is abnormal or we need to change your treatment, we will call you to review the results.  Testing/Procedures: None ordered  Follow-Up: Remote monitoring is used to monitor your Pacemaker or ICD from home. This monitoring reduces the number of office visits required to check your device to one time per year. It allows [place] to keep an eye on the functioning of your device to ensure it is working properly. You are scheduled for a device check from home on 11/02/2019. You may send your transmission at any time that day. If you have a wireless device, the transmission will be sent automatically. After your physician reviews your transmission, you will receive a postcard with your next transmission date.  Your physician wants you to follow-up in: 1 year with Dr. Curt Bears.  You will receive a reminder letter in the mail two months in advance. If you don't receive a letter, please call our office to schedule the follow-up appointment.   Thank you for choosing [facility]!!   Trinidad Curet, RN [phone]  Any Other Special Instructions Will Be Listed Below (If Applicable).

## 2019-08-18 LAB — CUP PACEART INCLINIC DEVICE CHECK
Battery Remaining Longevity: 60 mo
Battery Voltage: 2.99 V
Brady Statistic AP VP Percent: 1.18 %
Brady Statistic AP VS Percent: 77.71 %
Brady Statistic AS VP Percent: 0.23 %
Brady Statistic AS VS Percent: 20.88 %
Brady Statistic RA Percent Paced: 78.89 %
Brady Statistic RV Percent Paced: 1.47 %
Date Time Interrogation Session: 20201207124100
Implantable Lead Implant Date: 20151029
Implantable Lead Implant Date: 20151029
Implantable Lead Location: 753859
Implantable Lead Location: 753860
Implantable Lead Model: 5076
Implantable Lead Model: 5076
Implantable Pulse Generator Implant Date: 20151029
Lead Channel Impedance Value: 323 Ohm
Lead Channel Impedance Value: 380 Ohm
Lead Channel Impedance Value: 418 Ohm
Lead Channel Impedance Value: 456 Ohm
Lead Channel Pacing Threshold Amplitude: 0.5 V
Lead Channel Pacing Threshold Amplitude: 0.75 V
Lead Channel Pacing Threshold Pulse Width: 0.4 ms
Lead Channel Pacing Threshold Pulse Width: 0.4 ms
Lead Channel Sensing Intrinsic Amplitude: 13 mV
Lead Channel Sensing Intrinsic Amplitude: 2.5 mV
Lead Channel Setting Pacing Amplitude: 1.5 V
Lead Channel Setting Pacing Amplitude: 2 V
Lead Channel Setting Pacing Pulse Width: 0.4 ms
Lead Channel Setting Sensing Sensitivity: 0.9 mV

## 2019-09-08 DIAGNOSIS — G250 Essential tremor: Secondary | ICD-10-CM | POA: Diagnosis not present

## 2019-09-08 DIAGNOSIS — I4892 Unspecified atrial flutter: Secondary | ICD-10-CM | POA: Diagnosis not present

## 2019-09-08 DIAGNOSIS — E785 Hyperlipidemia, unspecified: Secondary | ICD-10-CM | POA: Diagnosis not present

## 2019-09-08 DIAGNOSIS — I10 Essential (primary) hypertension: Secondary | ICD-10-CM | POA: Diagnosis not present

## 2019-09-13 ENCOUNTER — Other Ambulatory Visit: Payer: Self-pay | Admitting: Cardiology

## 2019-11-02 ENCOUNTER — Ambulatory Visit (INDEPENDENT_AMBULATORY_CARE_PROVIDER_SITE_OTHER): Payer: Medicare Other | Admitting: *Deleted

## 2019-11-02 DIAGNOSIS — R001 Bradycardia, unspecified: Secondary | ICD-10-CM

## 2019-11-02 DIAGNOSIS — N390 Urinary tract infection, site not specified: Secondary | ICD-10-CM | POA: Diagnosis not present

## 2019-11-03 LAB — CUP PACEART REMOTE DEVICE CHECK
Battery Remaining Longevity: 58 mo
Battery Voltage: 2.99 V
Brady Statistic AP VP Percent: 0.78 %
Brady Statistic AP VS Percent: 77.71 %
Brady Statistic AS VP Percent: 0.09 %
Brady Statistic AS VS Percent: 21.42 %
Brady Statistic RA Percent Paced: 78.5 %
Brady Statistic RV Percent Paced: 0.89 %
Date Time Interrogation Session: 20210303201408
Implantable Lead Implant Date: 20151029
Implantable Lead Implant Date: 20151029
Implantable Lead Location: 753859
Implantable Lead Location: 753860
Implantable Lead Model: 5076
Implantable Lead Model: 5076
Implantable Pulse Generator Implant Date: 20151029
Lead Channel Impedance Value: 323 Ohm
Lead Channel Impedance Value: 380 Ohm
Lead Channel Impedance Value: 475 Ohm
Lead Channel Impedance Value: 513 Ohm
Lead Channel Pacing Threshold Amplitude: 0.5 V
Lead Channel Pacing Threshold Amplitude: 0.625 V
Lead Channel Pacing Threshold Pulse Width: 0.4 ms
Lead Channel Pacing Threshold Pulse Width: 0.4 ms
Lead Channel Sensing Intrinsic Amplitude: 11.125 mV
Lead Channel Sensing Intrinsic Amplitude: 11.125 mV
Lead Channel Sensing Intrinsic Amplitude: 2.5 mV
Lead Channel Sensing Intrinsic Amplitude: 2.5 mV
Lead Channel Setting Pacing Amplitude: 1.5 V
Lead Channel Setting Pacing Amplitude: 2 V
Lead Channel Setting Pacing Pulse Width: 0.4 ms
Lead Channel Setting Sensing Sensitivity: 0.9 mV

## 2019-11-03 NOTE — Progress Notes (Signed)
PPM Remote  

## 2019-11-21 DIAGNOSIS — H401132 Primary open-angle glaucoma, bilateral, moderate stage: Secondary | ICD-10-CM | POA: Diagnosis not present

## 2019-11-21 DIAGNOSIS — H16223 Keratoconjunctivitis sicca, not specified as Sjogren's, bilateral: Secondary | ICD-10-CM | POA: Diagnosis not present

## 2019-11-21 DIAGNOSIS — H2513 Age-related nuclear cataract, bilateral: Secondary | ICD-10-CM | POA: Diagnosis not present

## 2019-11-21 DIAGNOSIS — H11002 Unspecified pterygium of left eye: Secondary | ICD-10-CM | POA: Diagnosis not present

## 2019-12-08 DIAGNOSIS — G250 Essential tremor: Secondary | ICD-10-CM | POA: Diagnosis not present

## 2019-12-08 DIAGNOSIS — I4892 Unspecified atrial flutter: Secondary | ICD-10-CM | POA: Diagnosis not present

## 2019-12-08 DIAGNOSIS — Z79899 Other long term (current) drug therapy: Secondary | ICD-10-CM | POA: Diagnosis not present

## 2019-12-08 DIAGNOSIS — E785 Hyperlipidemia, unspecified: Secondary | ICD-10-CM | POA: Diagnosis not present

## 2019-12-08 DIAGNOSIS — J309 Allergic rhinitis, unspecified: Secondary | ICD-10-CM | POA: Diagnosis not present

## 2019-12-08 DIAGNOSIS — E559 Vitamin D deficiency, unspecified: Secondary | ICD-10-CM | POA: Diagnosis not present

## 2019-12-13 ENCOUNTER — Other Ambulatory Visit: Payer: Self-pay | Admitting: Cardiology

## 2019-12-20 DIAGNOSIS — I69114 Frontal lobe and executive function deficit following nontraumatic intracerebral hemorrhage: Secondary | ICD-10-CM | POA: Diagnosis not present

## 2019-12-20 DIAGNOSIS — Z8679 Personal history of other diseases of the circulatory system: Secondary | ICD-10-CM | POA: Diagnosis not present

## 2019-12-20 DIAGNOSIS — I480 Paroxysmal atrial fibrillation: Secondary | ICD-10-CM | POA: Diagnosis not present

## 2019-12-20 DIAGNOSIS — G40209 Localization-related (focal) (partial) symptomatic epilepsy and epileptic syndromes with complex partial seizures, not intractable, without status epilepticus: Secondary | ICD-10-CM | POA: Diagnosis not present

## 2019-12-22 DIAGNOSIS — G40209 Localization-related (focal) (partial) symptomatic epilepsy and epileptic syndromes with complex partial seizures, not intractable, without status epilepticus: Secondary | ICD-10-CM | POA: Diagnosis not present

## 2019-12-22 DIAGNOSIS — Z8679 Personal history of other diseases of the circulatory system: Secondary | ICD-10-CM | POA: Diagnosis not present

## 2019-12-29 ENCOUNTER — Encounter: Payer: Self-pay | Admitting: Cardiology

## 2020-01-24 DIAGNOSIS — Z87820 Personal history of traumatic brain injury: Secondary | ICD-10-CM | POA: Diagnosis not present

## 2020-01-24 DIAGNOSIS — Z043 Encounter for examination and observation following other accident: Secondary | ICD-10-CM | POA: Diagnosis not present

## 2020-01-24 DIAGNOSIS — I517 Cardiomegaly: Secondary | ICD-10-CM | POA: Diagnosis not present

## 2020-01-24 DIAGNOSIS — R569 Unspecified convulsions: Secondary | ICD-10-CM | POA: Diagnosis not present

## 2020-01-24 DIAGNOSIS — I69114 Frontal lobe and executive function deficit following nontraumatic intracerebral hemorrhage: Secondary | ICD-10-CM | POA: Diagnosis not present

## 2020-01-24 DIAGNOSIS — R55 Syncope and collapse: Secondary | ICD-10-CM | POA: Diagnosis not present

## 2020-01-24 DIAGNOSIS — G40219 Localization-related (focal) (partial) symptomatic epilepsy and epileptic syndromes with complex partial seizures, intractable, without status epilepticus: Secondary | ICD-10-CM | POA: Diagnosis not present

## 2020-01-25 DIAGNOSIS — I69114 Frontal lobe and executive function deficit following nontraumatic intracerebral hemorrhage: Secondary | ICD-10-CM | POA: Diagnosis not present

## 2020-01-25 DIAGNOSIS — R569 Unspecified convulsions: Secondary | ICD-10-CM | POA: Diagnosis not present

## 2020-01-26 DIAGNOSIS — I69114 Frontal lobe and executive function deficit following nontraumatic intracerebral hemorrhage: Secondary | ICD-10-CM | POA: Diagnosis not present

## 2020-01-26 DIAGNOSIS — R9401 Abnormal electroencephalogram [EEG]: Secondary | ICD-10-CM | POA: Diagnosis not present

## 2020-01-26 DIAGNOSIS — G9340 Encephalopathy, unspecified: Secondary | ICD-10-CM | POA: Diagnosis not present

## 2020-01-26 DIAGNOSIS — R569 Unspecified convulsions: Secondary | ICD-10-CM | POA: Diagnosis not present

## 2020-01-27 DIAGNOSIS — S069X9S Unspecified intracranial injury with loss of consciousness of unspecified duration, sequela: Secondary | ICD-10-CM | POA: Diagnosis not present

## 2020-01-27 DIAGNOSIS — R569 Unspecified convulsions: Secondary | ICD-10-CM | POA: Diagnosis not present

## 2020-01-27 DIAGNOSIS — G9340 Encephalopathy, unspecified: Secondary | ICD-10-CM | POA: Diagnosis not present

## 2020-01-27 DIAGNOSIS — R918 Other nonspecific abnormal finding of lung field: Secondary | ICD-10-CM | POA: Diagnosis not present

## 2020-01-27 DIAGNOSIS — R9401 Abnormal electroencephalogram [EEG]: Secondary | ICD-10-CM | POA: Diagnosis not present

## 2020-01-27 DIAGNOSIS — R0902 Hypoxemia: Secondary | ICD-10-CM | POA: Diagnosis not present

## 2020-01-27 DIAGNOSIS — I69114 Frontal lobe and executive function deficit following nontraumatic intracerebral hemorrhage: Secondary | ICD-10-CM | POA: Diagnosis not present

## 2020-01-27 DIAGNOSIS — R4182 Altered mental status, unspecified: Secondary | ICD-10-CM | POA: Diagnosis not present

## 2020-01-27 DIAGNOSIS — G40219 Localization-related (focal) (partial) symptomatic epilepsy and epileptic syndromes with complex partial seizures, intractable, without status epilepticus: Secondary | ICD-10-CM | POA: Diagnosis not present

## 2020-01-28 DIAGNOSIS — R569 Unspecified convulsions: Secondary | ICD-10-CM | POA: Diagnosis not present

## 2020-01-28 DIAGNOSIS — I371 Nonrheumatic pulmonary valve insufficiency: Secondary | ICD-10-CM | POA: Diagnosis not present

## 2020-02-01 ENCOUNTER — Ambulatory Visit (INDEPENDENT_AMBULATORY_CARE_PROVIDER_SITE_OTHER): Payer: Medicare Other | Admitting: *Deleted

## 2020-02-01 DIAGNOSIS — I495 Sick sinus syndrome: Secondary | ICD-10-CM

## 2020-02-01 LAB — CUP PACEART REMOTE DEVICE CHECK
Battery Remaining Longevity: 54 mo
Battery Voltage: 2.99 V
Brady Statistic AP VP Percent: 3.27 %
Brady Statistic AP VS Percent: 73.51 %
Brady Statistic AS VP Percent: 0.12 %
Brady Statistic AS VS Percent: 23.1 %
Brady Statistic RA Percent Paced: 76.83 %
Brady Statistic RV Percent Paced: 3.41 %
Date Time Interrogation Session: 20210602145527
Implantable Lead Implant Date: 20151029
Implantable Lead Implant Date: 20151029
Implantable Lead Location: 753859
Implantable Lead Location: 753860
Implantable Lead Model: 5076
Implantable Lead Model: 5076
Implantable Pulse Generator Implant Date: 20151029
Lead Channel Impedance Value: 304 Ohm
Lead Channel Impedance Value: 361 Ohm
Lead Channel Impedance Value: 437 Ohm
Lead Channel Impedance Value: 475 Ohm
Lead Channel Pacing Threshold Amplitude: 0.5 V
Lead Channel Pacing Threshold Amplitude: 0.875 V
Lead Channel Pacing Threshold Pulse Width: 0.4 ms
Lead Channel Pacing Threshold Pulse Width: 0.4 ms
Lead Channel Sensing Intrinsic Amplitude: 11 mV
Lead Channel Sensing Intrinsic Amplitude: 11 mV
Lead Channel Sensing Intrinsic Amplitude: 2.125 mV
Lead Channel Sensing Intrinsic Amplitude: 2.125 mV
Lead Channel Setting Pacing Amplitude: 1.5 V
Lead Channel Setting Pacing Amplitude: 2 V
Lead Channel Setting Pacing Pulse Width: 0.4 ms
Lead Channel Setting Sensing Sensitivity: 0.9 mV

## 2020-02-03 NOTE — Progress Notes (Signed)
Remote pacemaker transmission.   

## 2020-02-07 ENCOUNTER — Telehealth: Payer: Self-pay | Admitting: Cardiology

## 2020-02-07 DIAGNOSIS — G4734 Idiopathic sleep related nonobstructive alveolar hypoventilation: Secondary | ICD-10-CM | POA: Diagnosis not present

## 2020-02-07 DIAGNOSIS — E559 Vitamin D deficiency, unspecified: Secondary | ICD-10-CM | POA: Diagnosis not present

## 2020-02-07 DIAGNOSIS — F0391 Unspecified dementia with behavioral disturbance: Secondary | ICD-10-CM | POA: Diagnosis not present

## 2020-02-07 DIAGNOSIS — Z09 Encounter for follow-up examination after completed treatment for conditions other than malignant neoplasm: Secondary | ICD-10-CM | POA: Diagnosis not present

## 2020-02-07 DIAGNOSIS — Z79899 Other long term (current) drug therapy: Secondary | ICD-10-CM | POA: Diagnosis not present

## 2020-02-07 DIAGNOSIS — W19XXXD Unspecified fall, subsequent encounter: Secondary | ICD-10-CM | POA: Diagnosis not present

## 2020-02-07 NOTE — Telephone Encounter (Signed)
Patient's wife is calling to follow up in regards to the status of forms from [facility] faxed to the office on 02/01/20, to be completed by Dr. Curt Bears. She states the forms are a request for pacemaker settings in order to schedule an MRI. Please call.

## 2020-02-07 NOTE — Telephone Encounter (Signed)
Returned call to spouse, DPR on file.  No answer.  Left detailed message advising form was faxed back to [facility] radiology yesterday.

## 2020-02-09 DIAGNOSIS — I69114 Frontal lobe and executive function deficit following nontraumatic intracerebral hemorrhage: Secondary | ICD-10-CM | POA: Diagnosis not present

## 2020-02-09 DIAGNOSIS — I4891 Unspecified atrial fibrillation: Secondary | ICD-10-CM | POA: Diagnosis not present

## 2020-02-09 DIAGNOSIS — R451 Restlessness and agitation: Secondary | ICD-10-CM | POA: Diagnosis not present

## 2020-02-09 DIAGNOSIS — S069X9S Unspecified intracranial injury with loss of consciousness of unspecified duration, sequela: Secondary | ICD-10-CM | POA: Diagnosis not present

## 2020-02-09 DIAGNOSIS — R2689 Other abnormalities of gait and mobility: Secondary | ICD-10-CM | POA: Diagnosis not present

## 2020-02-09 DIAGNOSIS — G40209 Localization-related (focal) (partial) symptomatic epilepsy and epileptic syndromes with complex partial seizures, not intractable, without status epilepticus: Secondary | ICD-10-CM | POA: Diagnosis not present

## 2020-02-09 DIAGNOSIS — R569 Unspecified convulsions: Secondary | ICD-10-CM | POA: Diagnosis not present

## 2020-02-10 DIAGNOSIS — S069X9S Unspecified intracranial injury with loss of consciousness of unspecified duration, sequela: Secondary | ICD-10-CM | POA: Diagnosis not present

## 2020-02-10 DIAGNOSIS — G250 Essential tremor: Secondary | ICD-10-CM | POA: Diagnosis not present

## 2020-02-10 DIAGNOSIS — I495 Sick sinus syndrome: Secondary | ICD-10-CM | POA: Diagnosis not present

## 2020-02-10 DIAGNOSIS — F418 Other specified anxiety disorders: Secondary | ICD-10-CM | POA: Diagnosis not present

## 2020-02-10 DIAGNOSIS — Z950 Presence of cardiac pacemaker: Secondary | ICD-10-CM | POA: Diagnosis not present

## 2020-02-10 DIAGNOSIS — W19XXXD Unspecified fall, subsequent encounter: Secondary | ICD-10-CM | POA: Diagnosis not present

## 2020-02-10 DIAGNOSIS — H9193 Unspecified hearing loss, bilateral: Secondary | ICD-10-CM | POA: Diagnosis not present

## 2020-02-10 DIAGNOSIS — F29 Unspecified psychosis not due to a substance or known physiological condition: Secondary | ICD-10-CM | POA: Diagnosis not present

## 2020-02-10 DIAGNOSIS — R260 Ataxic gait: Secondary | ICD-10-CM | POA: Diagnosis not present

## 2020-02-10 DIAGNOSIS — I4892 Unspecified atrial flutter: Secondary | ICD-10-CM | POA: Diagnosis not present

## 2020-02-10 DIAGNOSIS — G40909 Epilepsy, unspecified, not intractable, without status epilepticus: Secondary | ICD-10-CM | POA: Diagnosis not present

## 2020-02-10 DIAGNOSIS — M1990 Unspecified osteoarthritis, unspecified site: Secondary | ICD-10-CM | POA: Diagnosis not present

## 2020-02-10 DIAGNOSIS — F0391 Unspecified dementia with behavioral disturbance: Secondary | ICD-10-CM | POA: Diagnosis not present

## 2020-02-10 DIAGNOSIS — I480 Paroxysmal atrial fibrillation: Secondary | ICD-10-CM | POA: Diagnosis not present

## 2020-02-10 DIAGNOSIS — E785 Hyperlipidemia, unspecified: Secondary | ICD-10-CM | POA: Diagnosis not present

## 2020-02-10 DIAGNOSIS — E559 Vitamin D deficiency, unspecified: Secondary | ICD-10-CM | POA: Diagnosis not present

## 2020-02-10 DIAGNOSIS — I8390 Asymptomatic varicose veins of unspecified lower extremity: Secondary | ICD-10-CM | POA: Diagnosis not present

## 2020-02-13 DIAGNOSIS — N390 Urinary tract infection, site not specified: Secondary | ICD-10-CM | POA: Diagnosis not present

## 2020-02-14 DIAGNOSIS — G40909 Epilepsy, unspecified, not intractable, without status epilepticus: Secondary | ICD-10-CM | POA: Diagnosis not present

## 2020-02-14 DIAGNOSIS — F0391 Unspecified dementia with behavioral disturbance: Secondary | ICD-10-CM | POA: Diagnosis not present

## 2020-02-14 DIAGNOSIS — F418 Other specified anxiety disorders: Secondary | ICD-10-CM | POA: Diagnosis not present

## 2020-02-14 DIAGNOSIS — I480 Paroxysmal atrial fibrillation: Secondary | ICD-10-CM | POA: Diagnosis not present

## 2020-02-14 DIAGNOSIS — F29 Unspecified psychosis not due to a substance or known physiological condition: Secondary | ICD-10-CM | POA: Diagnosis not present

## 2020-02-14 DIAGNOSIS — I8390 Asymptomatic varicose veins of unspecified lower extremity: Secondary | ICD-10-CM | POA: Diagnosis not present

## 2020-02-15 DIAGNOSIS — G40909 Epilepsy, unspecified, not intractable, without status epilepticus: Secondary | ICD-10-CM | POA: Diagnosis not present

## 2020-02-15 DIAGNOSIS — F418 Other specified anxiety disorders: Secondary | ICD-10-CM | POA: Diagnosis not present

## 2020-02-15 DIAGNOSIS — N401 Enlarged prostate with lower urinary tract symptoms: Secondary | ICD-10-CM | POA: Diagnosis not present

## 2020-02-15 DIAGNOSIS — F0391 Unspecified dementia with behavioral disturbance: Secondary | ICD-10-CM | POA: Diagnosis not present

## 2020-02-15 DIAGNOSIS — F29 Unspecified psychosis not due to a substance or known physiological condition: Secondary | ICD-10-CM | POA: Diagnosis not present

## 2020-02-15 DIAGNOSIS — I480 Paroxysmal atrial fibrillation: Secondary | ICD-10-CM | POA: Diagnosis not present

## 2020-02-15 DIAGNOSIS — I8390 Asymptomatic varicose veins of unspecified lower extremity: Secondary | ICD-10-CM | POA: Diagnosis not present

## 2020-02-15 DIAGNOSIS — N3289 Other specified disorders of bladder: Secondary | ICD-10-CM | POA: Diagnosis not present

## 2020-02-15 DIAGNOSIS — N319 Neuromuscular dysfunction of bladder, unspecified: Secondary | ICD-10-CM | POA: Diagnosis not present

## 2020-02-15 DIAGNOSIS — R310 Gross hematuria: Secondary | ICD-10-CM | POA: Diagnosis not present

## 2020-02-16 DIAGNOSIS — G4710 Hypersomnia, unspecified: Secondary | ICD-10-CM | POA: Diagnosis not present

## 2020-02-16 DIAGNOSIS — R569 Unspecified convulsions: Secondary | ICD-10-CM | POA: Diagnosis not present

## 2020-02-17 DIAGNOSIS — F0391 Unspecified dementia with behavioral disturbance: Secondary | ICD-10-CM | POA: Diagnosis not present

## 2020-02-17 DIAGNOSIS — G40909 Epilepsy, unspecified, not intractable, without status epilepticus: Secondary | ICD-10-CM | POA: Diagnosis not present

## 2020-02-17 DIAGNOSIS — G4710 Hypersomnia, unspecified: Secondary | ICD-10-CM | POA: Diagnosis not present

## 2020-02-17 DIAGNOSIS — F29 Unspecified psychosis not due to a substance or known physiological condition: Secondary | ICD-10-CM | POA: Diagnosis not present

## 2020-02-17 DIAGNOSIS — I8390 Asymptomatic varicose veins of unspecified lower extremity: Secondary | ICD-10-CM | POA: Diagnosis not present

## 2020-02-17 DIAGNOSIS — F418 Other specified anxiety disorders: Secondary | ICD-10-CM | POA: Diagnosis not present

## 2020-02-17 DIAGNOSIS — I480 Paroxysmal atrial fibrillation: Secondary | ICD-10-CM | POA: Diagnosis not present

## 2020-02-20 ENCOUNTER — Ambulatory Visit: Payer: Medicare Other | Admitting: Cardiology

## 2020-02-20 DIAGNOSIS — F0391 Unspecified dementia with behavioral disturbance: Secondary | ICD-10-CM | POA: Diagnosis not present

## 2020-02-20 DIAGNOSIS — I8390 Asymptomatic varicose veins of unspecified lower extremity: Secondary | ICD-10-CM | POA: Diagnosis not present

## 2020-02-20 DIAGNOSIS — G40909 Epilepsy, unspecified, not intractable, without status epilepticus: Secondary | ICD-10-CM | POA: Diagnosis not present

## 2020-02-20 DIAGNOSIS — I480 Paroxysmal atrial fibrillation: Secondary | ICD-10-CM | POA: Diagnosis not present

## 2020-02-20 DIAGNOSIS — F29 Unspecified psychosis not due to a substance or known physiological condition: Secondary | ICD-10-CM | POA: Diagnosis not present

## 2020-02-20 DIAGNOSIS — F418 Other specified anxiety disorders: Secondary | ICD-10-CM | POA: Diagnosis not present

## 2020-02-20 NOTE — Progress Notes (Signed)
Cardiology Office Note:    Date:  02/21/2020   ID:  [name], DOB 05/21/1946, MRN [id number]  PCP:  Nicholos Johns, MD  Cardiologist:  Shirlee More, MD    Referring MD: Nicholos Johns, MD    ASSESSMENT:    1. Atrial flutter, unspecified type ([place])   2. PAF (paroxysmal atrial fibrillation) (HCC)    PLAN:    In order of problems listed above:  1. He is on good clinical control with a combination of low-dose amiodarone and pacemaker.  He is having no toxicity I am concerned about drug interactions with his seizure medications I prompted his wife to ask the neurologist might do on blood levels.  His case I would not recommend anticoagulation 2. Hyperlipidemia I think is time to deprescribing statin will discontinue today   Next appointment: 6 months we'll follow her device clinic   Medication Adjustments/Labs and Tests Ordered: Current medicines are reviewed at length with the patient today.  Concerns regarding medicines are outlined above.  Orders Placed This Encounter  Procedures  . EKG 12-Lead   No orders of the defined types were placed in this encounter.   No chief complaint on file.   History of Present Illness:    Lawrence Braun is a 74 y.o. male with a hx of skull fracture in 2014 with a subsequent traumatic brain injury (frontal lobes), subdural hematoma and subarachnoid hemorrhage, Paroxysmal Atrial Fibrillation, Dyslipidemia, pacemaker on amiodarone last seen 03/14/2019.  He has had neurologic deterioration and now has a seizure disorder.  His last device check 02/01/2000 reviewed by me normal device parameters and function I reviewed the download and he has no atrial fibrillation and is currently in an atrially ventricular sensed rhythm and is ventricularly paced approximately 3% of the time. Compliance with diet, lifestyle and medications: Yes supervised by his wife  He is taking anticonvulsants and I think it is appropriate to deprescribing his statin to  avoid drug interactions and unintended side effects like muscle weakness.  He has had falls he has had seizures he has had no recurrent clinical atrial fibrillation predominantly is in atrial paced ventricular sensed rhythm his wife asked me my opinion of anticoagulation I think in this case risk exceeds benefit.  He tolerates amiodarone without thyroid or liver toxicity.  Recent labs his primary care physician 2020-02-07 show normal TSH 2.57 creatinine normal 1.09 and lipids are at target with a cholesterol 177 triglycerides 79 HDL 59 LDL 103. Past Medical History:  Diagnosis Date  . Allergy   . Anxiety   . Arm weakness 06/01/2013  . Arthritis   . Asthma   . Atrial flutter ([place])   . Balance problem 06/01/2013  . Bradycardia 10/31/2015  . Cardiac pacemaker in situ 02/22/2015  . Depression   . Episode of altered cognition 11/13/2012  . Fracture, skull ([place]) 02/28/2013   Overview:  occiptial, nondisplaced  . Generalized anxiety disorder 01/12/2014  . Glaucoma   . Headache 11/13/2012  . Hematoma 11/06/2012  . Hyperlipidemia   . Loss of hearing    rt ear after fall  . MDD (major depressive disorder), recurrent episode ([place]) 10/05/2013  . On amiodarone therapy 02/22/2015  . Pacemaker reprogramming/check 10/31/2015  . PAF (paroxysmal atrial fibrillation) ([place]) 02/22/2015   Overview:  CHADS2=1  . Partial epilepsy with impairment of consciousness, not intractable ([place]) 11/05/2015  . Sacral fracture ([place]) 11/06/2012   after falling on ice  . SDH (subdural hematoma) ([place]) 11/17/2012  . Seizures ([place])  05/23/2015  . SSS (sick sinus syndrome) ([place]) 05/15/2017  . Subarachnoid hemorrhage ([place]) 11/06/2012   after falling on ice  . Subdural hematoma ([place]) 11/06/2012   after falling on ice  . TBI (traumatic brain injury) ([place]) 10/05/2013  . Traumatic subarachnoid hemorrhage without loss of consciousness ([place]) 02/28/2013  . Tremor 05/15/2017    Past Surgical History:  Procedure Laterality Date  . INGUINAL HERNIA REPAIR  Right 1998  . LUMBAR LAMINECTOMY  1970    Current Medications: Current Meds  Medication Sig  . amiodarone (PACERONE) 200 MG tablet Take 1 tablet (200 mg total) by mouth daily. PLEASE KEEP UPCOMING APPOINTMENT IN [place]  . aspirin 81 MG tablet Take 81 mg by mouth daily.  . betaxolol (BETOPTIC-S) 0.5 % ophthalmic suspension Place 1 drop into both eyes daily.  . Calcium Carbonate-Vitamin D (CALCIUM + D PO) Take 1 tablet by mouth daily.   . Cholecalciferol (VITAMIN D PO) Take 1 tablet by mouth daily.   . clonazePAM (KLONOPIN) 0.5 MG tablet Take 0.25 mg by mouth 2 (two) times daily.  . Coenzyme Q10 (CO Q10) 100 MG CAPS Take 1 capsule by mouth daily.   Marland Kitchen donepezil (ARICEPT) 10 MG tablet Take 10 mg by mouth daily.   . fexofenadine (ALLEGRA) 180 MG tablet Take 180 mg by mouth daily.  . fluticasone (FLONASE) 50 MCG/ACT nasal spray Place 2 sprays into both nostrils daily.   Marland Kitchen LORazepam (ATIVAN) 0.5 MG tablet Take 1 tablet by mouth daily as needed.  . meloxicam (MOBIC) 7.5 MG tablet Take 7.5 mg by mouth daily.  . montelukast (SINGULAIR) 10 MG tablet Take 10 mg by mouth daily.  . Multiple Vitamin (MULTIVITAMIN) tablet Take 1 tablet by mouth daily.  . Psyllium (METAMUCIL PO) Take by mouth daily.  . risperiDONE (RISPERDAL) 0.5 MG tablet Take 0.5 mg by mouth 2 (two) times daily.  . sertraline (ZOLOFT) 100 MG tablet Take 100 mg by mouth at bedtime.  . tamsulosin (FLOMAX) 0.4 MG CAPS capsule Take 0.4 mg by mouth daily.  Marland Kitchen VIMPAT 100 MG TABS Take 1 tablet by mouth 2 (two) times daily.  . [DISCONTINUED] pravastatin (PRAVACHOL) 20 MG tablet TAKE 1 TABLET DAILY     Allergies:   Tramadol   Social History   Socioeconomic History  . Marital status: Married    Spouse name: Not on file  . Number of children: Not on file  . Years of education: Not on file  . Highest education level: Not on file  Occupational History  . Not on file  Tobacco Use  . Smoking status: Never Smoker  . Smokeless tobacco: Never  Used  Vaping Use  . Vaping Use: Never used  Substance and Sexual Activity  . Alcohol use: Yes    Comment: rare  . Drug use: No  . Sexual activity: Not on file  Other Topics Concern  . Not on file  Social History Narrative  . Not on file   Social Determinants of Health   Financial Resource Strain:   . Difficulty of Paying Living Expenses:   Food Insecurity:   . Worried About Charity fundraiser in the Last Year:   . Arboriculturist in the Last Year:   Transportation Needs:   . Film/video editor (Medical):   Marland Kitchen Lack of Transportation (Non-Medical):   Physical Activity:   . Days of Exercise per Week:   . Minutes of Exercise per Session:   Stress:   . Feeling of  Stress :   Social Connections:   . Frequency of Communication with Friends and Family:   . Frequency of Social Gatherings with Friends and Family:   . Attends Religious Services:   . Active Member of Clubs or Organizations:   . Attends Archivist Meetings:   Marland Kitchen Marital Status:      Family History: The patient's family history includes Colon cancer (age of onset: 63) in his brother; Heart attack in his father; Stroke in his mother. There is no history of Esophageal cancer, Rectal cancer, or Stomach cancer. ROS:   Please see the history of present illness.    All other systems reviewed and are negative.  EKGs/Labs/Other Studies Reviewed:    The following studies were reviewed today:  EKG:  EKG ordered today and personally reviewed.  The ekg ordered today demonstrates atrially paced rhythm normal QRS morphology normal QT interval  Recent Labs: 03/14/2019: ALT 23; BUN 18; Creatinine, Ser 1.00; Hemoglobin 16.3; Platelets 221; Potassium 4.9; Sodium 145; TSH 2.690  Recent Lipid Panel    Component Value Date/Time   CHOL 188 03/14/2019 1133   TRIG 87 03/14/2019 1133   HDL 58 03/14/2019 1133   CHOLHDL 3.2 03/14/2019 1133   LDLCALC 113 (H) 03/14/2019 1133    Physical Exam:    VS:  BP 100/60   Pulse  61   Ht 6\' 3"  (1.905 m)   Wt 193 lb (87.5 kg)   SpO2 95%   BMI 24.12 kg/m     Wt Readings from Last 3 Encounters:  02/21/20 193 lb (87.5 kg)  08/08/19 194 lb (88 kg)  03/14/19 186 lb 3.2 oz (84.5 kg)     GEN: Apathetic well nourished, well developed in no acute distress HEENT: Normal NECK: No JVD; No carotid bruits LYMPHATICS: No lymphadenopathy CARDIAC: RRR, no murmurs, rubs, gallops RESPIRATORY:  Clear to auscultation without rales, wheezing or rhonchi  ABDOMEN: Soft, non-tender, non-distended MUSCULOSKELETAL:  No edema; No deformity  SKIN: Warm and dry NEUROLOGIC:  Alert and oriented x 3 PSYCHIATRIC:  Normal affect    Signed, Shirlee More, MD  02/21/2020 11:16 AM    [place]

## 2020-02-21 ENCOUNTER — Other Ambulatory Visit: Payer: Self-pay

## 2020-02-21 ENCOUNTER — Encounter: Payer: Self-pay | Admitting: Cardiology

## 2020-02-21 ENCOUNTER — Ambulatory Visit (INDEPENDENT_AMBULATORY_CARE_PROVIDER_SITE_OTHER): Payer: Medicare Other | Admitting: Cardiology

## 2020-02-21 VITALS — BP 100/60 | HR 61 | Ht 75.0 in | Wt 193.0 lb

## 2020-02-21 DIAGNOSIS — I480 Paroxysmal atrial fibrillation: Secondary | ICD-10-CM | POA: Diagnosis not present

## 2020-02-21 DIAGNOSIS — I4892 Unspecified atrial flutter: Secondary | ICD-10-CM

## 2020-02-21 MED ORDER — AMIODARONE HCL 200 MG PO TABS: 200.0000 mg | ORAL_TABLET | Freq: Every day | ORAL | 3 refills | Status: AC

## 2020-02-21 NOTE — Patient Instructions (Signed)
Medication Instructions:  Your physician has recommended you make the following change in your medication:  STOP: Pravastatin  *If you need a refill on your cardiac medications before your next appointment, please call your pharmacy*   Lab Work: None If you have labs (blood work) drawn today and your tests are completely normal, you will receive your results only by: Marland Kitchen MyChart Message (if you have MyChart) OR . A paper copy in the mail If you have any lab test that is abnormal or we need to change your treatment, we will call you to review the results.   Testing/Procedures: None   Follow-Up: At [facility], you and your health needs are our priority.  As part of our continuing mission to provide you with exceptional heart care, we have created designated Provider Care Teams.  These Care Teams include your primary Cardiologist (physician) and Advanced Practice Providers (APPs -  Physician Assistants and Nurse Practitioners) who all work together to provide you with the care you need, when you need it.  We recommend signing up for the patient portal called "MyChart".  Sign up information is provided on this After Visit Summary.  MyChart is used to connect with patients for Virtual Visits (Telemedicine).  Patients are able to view lab/test results, encounter notes, upcoming appointments, etc.  Non-urgent messages can be sent to your provider as well.   To learn more about what you can do with MyChart, go to [email or web address].    Your next appointment:   6 month(s)  The format for your next appointment:   In Person  Provider:   Shirlee More, MD   Other Instructions

## 2020-02-21 NOTE — Addendum Note (Signed)
Addended by: Resa Miner I on: 02/21/2020 11:20 AM   Modules accepted: Orders

## 2020-02-24 DIAGNOSIS — F418 Other specified anxiety disorders: Secondary | ICD-10-CM | POA: Diagnosis not present

## 2020-02-24 DIAGNOSIS — I8390 Asymptomatic varicose veins of unspecified lower extremity: Secondary | ICD-10-CM | POA: Diagnosis not present

## 2020-02-24 DIAGNOSIS — I480 Paroxysmal atrial fibrillation: Secondary | ICD-10-CM | POA: Diagnosis not present

## 2020-02-24 DIAGNOSIS — F29 Unspecified psychosis not due to a substance or known physiological condition: Secondary | ICD-10-CM | POA: Diagnosis not present

## 2020-02-24 DIAGNOSIS — F0391 Unspecified dementia with behavioral disturbance: Secondary | ICD-10-CM | POA: Diagnosis not present

## 2020-02-24 DIAGNOSIS — G40909 Epilepsy, unspecified, not intractable, without status epilepticus: Secondary | ICD-10-CM | POA: Diagnosis not present

## 2020-02-26 DIAGNOSIS — G4710 Hypersomnia, unspecified: Secondary | ICD-10-CM | POA: Diagnosis not present

## 2020-02-27 DIAGNOSIS — F418 Other specified anxiety disorders: Secondary | ICD-10-CM | POA: Diagnosis not present

## 2020-02-27 DIAGNOSIS — I8390 Asymptomatic varicose veins of unspecified lower extremity: Secondary | ICD-10-CM | POA: Diagnosis not present

## 2020-02-27 DIAGNOSIS — F0391 Unspecified dementia with behavioral disturbance: Secondary | ICD-10-CM | POA: Diagnosis not present

## 2020-02-27 DIAGNOSIS — G40909 Epilepsy, unspecified, not intractable, without status epilepticus: Secondary | ICD-10-CM | POA: Diagnosis not present

## 2020-02-27 DIAGNOSIS — F29 Unspecified psychosis not due to a substance or known physiological condition: Secondary | ICD-10-CM | POA: Diagnosis not present

## 2020-02-27 DIAGNOSIS — I480 Paroxysmal atrial fibrillation: Secondary | ICD-10-CM | POA: Diagnosis not present

## 2020-02-29 DIAGNOSIS — G40909 Epilepsy, unspecified, not intractable, without status epilepticus: Secondary | ICD-10-CM | POA: Diagnosis not present

## 2020-02-29 DIAGNOSIS — F0391 Unspecified dementia with behavioral disturbance: Secondary | ICD-10-CM | POA: Diagnosis not present

## 2020-02-29 DIAGNOSIS — I8390 Asymptomatic varicose veins of unspecified lower extremity: Secondary | ICD-10-CM | POA: Diagnosis not present

## 2020-02-29 DIAGNOSIS — I480 Paroxysmal atrial fibrillation: Secondary | ICD-10-CM | POA: Diagnosis not present

## 2020-02-29 DIAGNOSIS — F418 Other specified anxiety disorders: Secondary | ICD-10-CM | POA: Diagnosis not present

## 2020-02-29 DIAGNOSIS — F29 Unspecified psychosis not due to a substance or known physiological condition: Secondary | ICD-10-CM | POA: Diagnosis not present

## 2020-03-02 DIAGNOSIS — F418 Other specified anxiety disorders: Secondary | ICD-10-CM | POA: Diagnosis not present

## 2020-03-02 DIAGNOSIS — I8390 Asymptomatic varicose veins of unspecified lower extremity: Secondary | ICD-10-CM | POA: Diagnosis not present

## 2020-03-02 DIAGNOSIS — I480 Paroxysmal atrial fibrillation: Secondary | ICD-10-CM | POA: Diagnosis not present

## 2020-03-02 DIAGNOSIS — F0391 Unspecified dementia with behavioral disturbance: Secondary | ICD-10-CM | POA: Diagnosis not present

## 2020-03-02 DIAGNOSIS — F29 Unspecified psychosis not due to a substance or known physiological condition: Secondary | ICD-10-CM | POA: Diagnosis not present

## 2020-03-02 DIAGNOSIS — G40909 Epilepsy, unspecified, not intractable, without status epilepticus: Secondary | ICD-10-CM | POA: Diagnosis not present

## 2020-03-07 DIAGNOSIS — F0391 Unspecified dementia with behavioral disturbance: Secondary | ICD-10-CM | POA: Diagnosis not present

## 2020-03-07 DIAGNOSIS — F418 Other specified anxiety disorders: Secondary | ICD-10-CM | POA: Diagnosis not present

## 2020-03-07 DIAGNOSIS — G40909 Epilepsy, unspecified, not intractable, without status epilepticus: Secondary | ICD-10-CM | POA: Diagnosis not present

## 2020-03-07 DIAGNOSIS — F29 Unspecified psychosis not due to a substance or known physiological condition: Secondary | ICD-10-CM | POA: Diagnosis not present

## 2020-03-07 DIAGNOSIS — I480 Paroxysmal atrial fibrillation: Secondary | ICD-10-CM | POA: Diagnosis not present

## 2020-03-07 DIAGNOSIS — I8390 Asymptomatic varicose veins of unspecified lower extremity: Secondary | ICD-10-CM | POA: Diagnosis not present

## 2020-03-08 DIAGNOSIS — G40909 Epilepsy, unspecified, not intractable, without status epilepticus: Secondary | ICD-10-CM | POA: Diagnosis not present

## 2020-03-08 DIAGNOSIS — I8390 Asymptomatic varicose veins of unspecified lower extremity: Secondary | ICD-10-CM | POA: Diagnosis not present

## 2020-03-08 DIAGNOSIS — F418 Other specified anxiety disorders: Secondary | ICD-10-CM | POA: Diagnosis not present

## 2020-03-08 DIAGNOSIS — F29 Unspecified psychosis not due to a substance or known physiological condition: Secondary | ICD-10-CM | POA: Diagnosis not present

## 2020-03-08 DIAGNOSIS — I480 Paroxysmal atrial fibrillation: Secondary | ICD-10-CM | POA: Diagnosis not present

## 2020-03-08 DIAGNOSIS — F0391 Unspecified dementia with behavioral disturbance: Secondary | ICD-10-CM | POA: Diagnosis not present

## 2020-03-11 DIAGNOSIS — G40909 Epilepsy, unspecified, not intractable, without status epilepticus: Secondary | ICD-10-CM | POA: Diagnosis not present

## 2020-03-11 DIAGNOSIS — W19XXXD Unspecified fall, subsequent encounter: Secondary | ICD-10-CM | POA: Diagnosis not present

## 2020-03-11 DIAGNOSIS — I4892 Unspecified atrial flutter: Secondary | ICD-10-CM | POA: Diagnosis not present

## 2020-03-11 DIAGNOSIS — M1990 Unspecified osteoarthritis, unspecified site: Secondary | ICD-10-CM | POA: Diagnosis not present

## 2020-03-11 DIAGNOSIS — I8390 Asymptomatic varicose veins of unspecified lower extremity: Secondary | ICD-10-CM | POA: Diagnosis not present

## 2020-03-11 DIAGNOSIS — F418 Other specified anxiety disorders: Secondary | ICD-10-CM | POA: Diagnosis not present

## 2020-03-11 DIAGNOSIS — E559 Vitamin D deficiency, unspecified: Secondary | ICD-10-CM | POA: Diagnosis not present

## 2020-03-11 DIAGNOSIS — F29 Unspecified psychosis not due to a substance or known physiological condition: Secondary | ICD-10-CM | POA: Diagnosis not present

## 2020-03-11 DIAGNOSIS — I480 Paroxysmal atrial fibrillation: Secondary | ICD-10-CM | POA: Diagnosis not present

## 2020-03-11 DIAGNOSIS — I495 Sick sinus syndrome: Secondary | ICD-10-CM | POA: Diagnosis not present

## 2020-03-11 DIAGNOSIS — R260 Ataxic gait: Secondary | ICD-10-CM | POA: Diagnosis not present

## 2020-03-11 DIAGNOSIS — G250 Essential tremor: Secondary | ICD-10-CM | POA: Diagnosis not present

## 2020-03-11 DIAGNOSIS — S069X9S Unspecified intracranial injury with loss of consciousness of unspecified duration, sequela: Secondary | ICD-10-CM | POA: Diagnosis not present

## 2020-03-11 DIAGNOSIS — H9193 Unspecified hearing loss, bilateral: Secondary | ICD-10-CM | POA: Diagnosis not present

## 2020-03-11 DIAGNOSIS — E785 Hyperlipidemia, unspecified: Secondary | ICD-10-CM | POA: Diagnosis not present

## 2020-03-11 DIAGNOSIS — F0391 Unspecified dementia with behavioral disturbance: Secondary | ICD-10-CM | POA: Diagnosis not present

## 2020-03-11 DIAGNOSIS — Z950 Presence of cardiac pacemaker: Secondary | ICD-10-CM | POA: Diagnosis not present

## 2020-03-12 DIAGNOSIS — F418 Other specified anxiety disorders: Secondary | ICD-10-CM | POA: Diagnosis not present

## 2020-03-12 DIAGNOSIS — I480 Paroxysmal atrial fibrillation: Secondary | ICD-10-CM | POA: Diagnosis not present

## 2020-03-12 DIAGNOSIS — F0391 Unspecified dementia with behavioral disturbance: Secondary | ICD-10-CM | POA: Diagnosis not present

## 2020-03-12 DIAGNOSIS — F29 Unspecified psychosis not due to a substance or known physiological condition: Secondary | ICD-10-CM | POA: Diagnosis not present

## 2020-03-12 DIAGNOSIS — G40909 Epilepsy, unspecified, not intractable, without status epilepticus: Secondary | ICD-10-CM | POA: Diagnosis not present

## 2020-03-12 DIAGNOSIS — I8390 Asymptomatic varicose veins of unspecified lower extremity: Secondary | ICD-10-CM | POA: Diagnosis not present

## 2020-03-14 DIAGNOSIS — Z0000 Encounter for general adult medical examination without abnormal findings: Secondary | ICD-10-CM | POA: Diagnosis not present

## 2020-03-14 DIAGNOSIS — E785 Hyperlipidemia, unspecified: Secondary | ICD-10-CM | POA: Diagnosis not present

## 2020-03-14 DIAGNOSIS — Z1331 Encounter for screening for depression: Secondary | ICD-10-CM | POA: Diagnosis not present

## 2020-03-14 DIAGNOSIS — Z9181 History of falling: Secondary | ICD-10-CM | POA: Diagnosis not present

## 2020-03-16 DIAGNOSIS — I8390 Asymptomatic varicose veins of unspecified lower extremity: Secondary | ICD-10-CM | POA: Diagnosis not present

## 2020-03-16 DIAGNOSIS — F29 Unspecified psychosis not due to a substance or known physiological condition: Secondary | ICD-10-CM | POA: Diagnosis not present

## 2020-03-16 DIAGNOSIS — F418 Other specified anxiety disorders: Secondary | ICD-10-CM | POA: Diagnosis not present

## 2020-03-16 DIAGNOSIS — G40909 Epilepsy, unspecified, not intractable, without status epilepticus: Secondary | ICD-10-CM | POA: Diagnosis not present

## 2020-03-16 DIAGNOSIS — F0391 Unspecified dementia with behavioral disturbance: Secondary | ICD-10-CM | POA: Diagnosis not present

## 2020-03-16 DIAGNOSIS — I480 Paroxysmal atrial fibrillation: Secondary | ICD-10-CM | POA: Diagnosis not present

## 2020-03-19 DIAGNOSIS — F29 Unspecified psychosis not due to a substance or known physiological condition: Secondary | ICD-10-CM | POA: Diagnosis not present

## 2020-03-19 DIAGNOSIS — I480 Paroxysmal atrial fibrillation: Secondary | ICD-10-CM | POA: Diagnosis not present

## 2020-03-19 DIAGNOSIS — F418 Other specified anxiety disorders: Secondary | ICD-10-CM | POA: Diagnosis not present

## 2020-03-19 DIAGNOSIS — G40909 Epilepsy, unspecified, not intractable, without status epilepticus: Secondary | ICD-10-CM | POA: Diagnosis not present

## 2020-03-19 DIAGNOSIS — F0391 Unspecified dementia with behavioral disturbance: Secondary | ICD-10-CM | POA: Diagnosis not present

## 2020-03-19 DIAGNOSIS — I8390 Asymptomatic varicose veins of unspecified lower extremity: Secondary | ICD-10-CM | POA: Diagnosis not present

## 2020-03-20 DIAGNOSIS — F0391 Unspecified dementia with behavioral disturbance: Secondary | ICD-10-CM | POA: Diagnosis not present

## 2020-03-20 DIAGNOSIS — F418 Other specified anxiety disorders: Secondary | ICD-10-CM | POA: Diagnosis not present

## 2020-03-20 DIAGNOSIS — F29 Unspecified psychosis not due to a substance or known physiological condition: Secondary | ICD-10-CM | POA: Diagnosis not present

## 2020-03-20 DIAGNOSIS — G40909 Epilepsy, unspecified, not intractable, without status epilepticus: Secondary | ICD-10-CM | POA: Diagnosis not present

## 2020-03-20 DIAGNOSIS — I480 Paroxysmal atrial fibrillation: Secondary | ICD-10-CM | POA: Diagnosis not present

## 2020-03-20 DIAGNOSIS — I8390 Asymptomatic varicose veins of unspecified lower extremity: Secondary | ICD-10-CM | POA: Diagnosis not present

## 2020-03-28 DIAGNOSIS — I480 Paroxysmal atrial fibrillation: Secondary | ICD-10-CM | POA: Diagnosis not present

## 2020-03-28 DIAGNOSIS — I8390 Asymptomatic varicose veins of unspecified lower extremity: Secondary | ICD-10-CM | POA: Diagnosis not present

## 2020-03-28 DIAGNOSIS — G40909 Epilepsy, unspecified, not intractable, without status epilepticus: Secondary | ICD-10-CM | POA: Diagnosis not present

## 2020-03-28 DIAGNOSIS — F418 Other specified anxiety disorders: Secondary | ICD-10-CM | POA: Diagnosis not present

## 2020-03-28 DIAGNOSIS — F0391 Unspecified dementia with behavioral disturbance: Secondary | ICD-10-CM | POA: Diagnosis not present

## 2020-03-28 DIAGNOSIS — F29 Unspecified psychosis not due to a substance or known physiological condition: Secondary | ICD-10-CM | POA: Diagnosis not present

## 2020-04-02 DIAGNOSIS — F418 Other specified anxiety disorders: Secondary | ICD-10-CM | POA: Diagnosis not present

## 2020-04-30 DIAGNOSIS — H16223 Keratoconjunctivitis sicca, not specified as Sjogren's, bilateral: Secondary | ICD-10-CM | POA: Diagnosis not present

## 2020-04-30 DIAGNOSIS — H11002 Unspecified pterygium of left eye: Secondary | ICD-10-CM | POA: Diagnosis not present

## 2020-04-30 DIAGNOSIS — H401132 Primary open-angle glaucoma, bilateral, moderate stage: Secondary | ICD-10-CM | POA: Diagnosis not present

## 2020-04-30 DIAGNOSIS — H2513 Age-related nuclear cataract, bilateral: Secondary | ICD-10-CM | POA: Diagnosis not present

## 2020-05-02 ENCOUNTER — Ambulatory Visit (INDEPENDENT_AMBULATORY_CARE_PROVIDER_SITE_OTHER): Payer: Medicare Other | Admitting: *Deleted

## 2020-05-02 DIAGNOSIS — I495 Sick sinus syndrome: Secondary | ICD-10-CM

## 2020-05-03 LAB — CUP PACEART REMOTE DEVICE CHECK
Battery Remaining Longevity: 52 mo
Battery Voltage: 2.98 V
Brady Statistic AP VP Percent: 2.66 %
Brady Statistic AP VS Percent: 76.17 %
Brady Statistic AS VP Percent: 0.19 %
Brady Statistic AS VS Percent: 20.98 %
Brady Statistic RA Percent Paced: 78.83 %
Brady Statistic RV Percent Paced: 2.81 %
Date Time Interrogation Session: 20210901131827
Implantable Lead Implant Date: 20151029
Implantable Lead Implant Date: 20151029
Implantable Lead Location: 753859
Implantable Lead Location: 753860
Implantable Lead Model: 5076
Implantable Lead Model: 5076
Implantable Pulse Generator Implant Date: 20151029
Lead Channel Impedance Value: 342 Ohm
Lead Channel Impedance Value: 380 Ohm
Lead Channel Impedance Value: 437 Ohm
Lead Channel Impedance Value: 475 Ohm
Lead Channel Pacing Threshold Amplitude: 0.5 V
Lead Channel Pacing Threshold Amplitude: 0.75 V
Lead Channel Pacing Threshold Pulse Width: 0.4 ms
Lead Channel Pacing Threshold Pulse Width: 0.4 ms
Lead Channel Sensing Intrinsic Amplitude: 10.875 mV
Lead Channel Sensing Intrinsic Amplitude: 10.875 mV
Lead Channel Sensing Intrinsic Amplitude: 2.375 mV
Lead Channel Sensing Intrinsic Amplitude: 2.375 mV
Lead Channel Setting Pacing Amplitude: 1.5 V
Lead Channel Setting Pacing Amplitude: 2 V
Lead Channel Setting Pacing Pulse Width: 0.4 ms
Lead Channel Setting Sensing Sensitivity: 0.9 mV

## 2020-05-03 NOTE — Progress Notes (Signed)
Remote pacemaker transmission.   

## 2020-05-14 DIAGNOSIS — I69114 Frontal lobe and executive function deficit following nontraumatic intracerebral hemorrhage: Secondary | ICD-10-CM | POA: Diagnosis not present

## 2020-05-14 DIAGNOSIS — I4891 Unspecified atrial fibrillation: Secondary | ICD-10-CM | POA: Diagnosis not present

## 2020-05-14 DIAGNOSIS — G40209 Localization-related (focal) (partial) symptomatic epilepsy and epileptic syndromes with complex partial seizures, not intractable, without status epilepticus: Secondary | ICD-10-CM | POA: Diagnosis not present

## 2020-05-14 DIAGNOSIS — S069X9S Unspecified intracranial injury with loss of consciousness of unspecified duration, sequela: Secondary | ICD-10-CM | POA: Diagnosis not present

## 2020-05-18 DIAGNOSIS — F0281 Dementia in other diseases classified elsewhere with behavioral disturbance: Secondary | ICD-10-CM | POA: Diagnosis not present

## 2020-05-18 DIAGNOSIS — F339 Major depressive disorder, recurrent, unspecified: Secondary | ICD-10-CM | POA: Diagnosis not present

## 2020-05-18 DIAGNOSIS — F064 Anxiety disorder due to known physiological condition: Secondary | ICD-10-CM | POA: Diagnosis not present

## 2020-05-25 DIAGNOSIS — F064 Anxiety disorder due to known physiological condition: Secondary | ICD-10-CM | POA: Diagnosis not present

## 2020-05-25 DIAGNOSIS — F0281 Dementia in other diseases classified elsewhere with behavioral disturbance: Secondary | ICD-10-CM | POA: Diagnosis not present

## 2020-05-25 DIAGNOSIS — F339 Major depressive disorder, recurrent, unspecified: Secondary | ICD-10-CM | POA: Diagnosis not present

## 2020-06-11 DIAGNOSIS — Z6824 Body mass index (BMI) 24.0-24.9, adult: Secondary | ICD-10-CM | POA: Diagnosis not present

## 2020-06-11 DIAGNOSIS — R413 Other amnesia: Secondary | ICD-10-CM | POA: Diagnosis not present

## 2020-06-11 DIAGNOSIS — I495 Sick sinus syndrome: Secondary | ICD-10-CM | POA: Diagnosis not present

## 2020-06-11 DIAGNOSIS — E785 Hyperlipidemia, unspecified: Secondary | ICD-10-CM | POA: Diagnosis not present

## 2020-06-11 DIAGNOSIS — E559 Vitamin D deficiency, unspecified: Secondary | ICD-10-CM | POA: Diagnosis not present

## 2020-06-11 DIAGNOSIS — I4892 Unspecified atrial flutter: Secondary | ICD-10-CM | POA: Diagnosis not present

## 2020-06-11 DIAGNOSIS — J309 Allergic rhinitis, unspecified: Secondary | ICD-10-CM | POA: Diagnosis not present

## 2020-06-11 DIAGNOSIS — R531 Weakness: Secondary | ICD-10-CM | POA: Diagnosis not present

## 2020-06-11 DIAGNOSIS — G250 Essential tremor: Secondary | ICD-10-CM | POA: Diagnosis not present

## 2020-06-11 DIAGNOSIS — G40909 Epilepsy, unspecified, not intractable, without status epilepticus: Secondary | ICD-10-CM | POA: Diagnosis not present

## 2020-06-11 DIAGNOSIS — I10 Essential (primary) hypertension: Secondary | ICD-10-CM | POA: Diagnosis not present

## 2020-06-11 DIAGNOSIS — F418 Other specified anxiety disorders: Secondary | ICD-10-CM | POA: Diagnosis not present

## 2020-07-09 DIAGNOSIS — N390 Urinary tract infection, site not specified: Secondary | ICD-10-CM | POA: Diagnosis not present

## 2020-08-01 ENCOUNTER — Ambulatory Visit (INDEPENDENT_AMBULATORY_CARE_PROVIDER_SITE_OTHER): Payer: Medicare Other

## 2020-08-01 DIAGNOSIS — I495 Sick sinus syndrome: Secondary | ICD-10-CM

## 2020-08-02 DIAGNOSIS — F064 Anxiety disorder due to known physiological condition: Secondary | ICD-10-CM | POA: Diagnosis not present

## 2020-08-02 DIAGNOSIS — F339 Major depressive disorder, recurrent, unspecified: Secondary | ICD-10-CM | POA: Diagnosis not present

## 2020-08-02 DIAGNOSIS — F0281 Dementia in other diseases classified elsewhere with behavioral disturbance: Secondary | ICD-10-CM | POA: Diagnosis not present

## 2020-08-03 LAB — CUP PACEART REMOTE DEVICE CHECK
Battery Remaining Longevity: 54 mo
Battery Voltage: 2.98 V
Brady Statistic AP VP Percent: 2.53 %
Brady Statistic AP VS Percent: 67.13 %
Brady Statistic AS VP Percent: 3.44 %
Brady Statistic AS VS Percent: 26.89 %
Brady Statistic RA Percent Paced: 69.15 %
Brady Statistic RV Percent Paced: 5.81 %
Date Time Interrogation Session: 20211201170326
Implantable Lead Implant Date: 20151029
Implantable Lead Implant Date: 20151029
Implantable Lead Location: 753859
Implantable Lead Location: 753860
Implantable Lead Model: 5076
Implantable Lead Model: 5076
Implantable Pulse Generator Implant Date: 20151029
Lead Channel Impedance Value: 323 Ohm
Lead Channel Impedance Value: 380 Ohm
Lead Channel Impedance Value: 513 Ohm
Lead Channel Impedance Value: 551 Ohm
Lead Channel Pacing Threshold Amplitude: 0.5 V
Lead Channel Pacing Threshold Amplitude: 0.75 V
Lead Channel Pacing Threshold Pulse Width: 0.4 ms
Lead Channel Pacing Threshold Pulse Width: 0.4 ms
Lead Channel Sensing Intrinsic Amplitude: 14.125 mV
Lead Channel Sensing Intrinsic Amplitude: 14.125 mV
Lead Channel Sensing Intrinsic Amplitude: 2.5 mV
Lead Channel Sensing Intrinsic Amplitude: 2.5 mV
Lead Channel Setting Pacing Amplitude: 1.5 V
Lead Channel Setting Pacing Amplitude: 2 V
Lead Channel Setting Pacing Pulse Width: 0.4 ms
Lead Channel Setting Sensing Sensitivity: 0.9 mV

## 2020-08-06 ENCOUNTER — Ambulatory Visit (INDEPENDENT_AMBULATORY_CARE_PROVIDER_SITE_OTHER): Payer: Medicare Other | Admitting: Cardiology

## 2020-08-06 ENCOUNTER — Encounter: Payer: Self-pay | Admitting: Cardiology

## 2020-08-06 ENCOUNTER — Other Ambulatory Visit: Payer: Self-pay

## 2020-08-06 VITALS — BP 110/70 | HR 69 | Ht 75.0 in

## 2020-08-06 DIAGNOSIS — Z79899 Other long term (current) drug therapy: Secondary | ICD-10-CM | POA: Diagnosis not present

## 2020-08-06 DIAGNOSIS — I495 Sick sinus syndrome: Secondary | ICD-10-CM

## 2020-08-06 DIAGNOSIS — I480 Paroxysmal atrial fibrillation: Secondary | ICD-10-CM

## 2020-08-06 LAB — CUP PACEART INCLINIC DEVICE CHECK
Battery Remaining Longevity: 47 mo
Battery Voltage: 2.98 V
Brady Statistic AP VP Percent: 2.54 %
Brady Statistic AP VS Percent: 71.07 %
Brady Statistic AS VP Percent: 1.91 %
Brady Statistic AS VS Percent: 24.48 %
Brady Statistic RA Percent Paced: 73.32 %
Brady Statistic RV Percent Paced: 4.34 %
Date Time Interrogation Session: 20211206161800
Implantable Lead Implant Date: 20151029
Implantable Lead Implant Date: 20151029
Implantable Lead Location: 753859
Implantable Lead Location: 753860
Implantable Lead Model: 5076
Implantable Lead Model: 5076
Implantable Pulse Generator Implant Date: 20151029
Lead Channel Impedance Value: 342 Ohm
Lead Channel Impedance Value: 399 Ohm
Lead Channel Impedance Value: 494 Ohm
Lead Channel Impedance Value: 551 Ohm
Lead Channel Pacing Threshold Amplitude: 0.5 V
Lead Channel Pacing Threshold Amplitude: 0.5 V
Lead Channel Pacing Threshold Pulse Width: 0.4 ms
Lead Channel Pacing Threshold Pulse Width: 0.4 ms
Lead Channel Sensing Intrinsic Amplitude: 15.125 mV
Lead Channel Sensing Intrinsic Amplitude: 2.625 mV
Lead Channel Setting Pacing Amplitude: 1.5 V
Lead Channel Setting Pacing Amplitude: 2 V
Lead Channel Setting Pacing Pulse Width: 0.4 ms
Lead Channel Setting Sensing Sensitivity: 0.9 mV

## 2020-08-06 NOTE — Patient Instructions (Addendum)
Medication Instructions:  Your physician recommends that you continue on your current medications as directed. Please refer to the Current Medication list given to you today.  *If you need a refill on your cardiac medications before your next appointment, please call your pharmacy*   Lab Work: Today: TSH & LFTs If you have labs (blood work) drawn today and your tests are completely normal, you will receive your results only by: Marland Kitchen MyChart Message (if you have MyChart) OR . A paper copy in the mail If you have any lab test that is abnormal or we need to change your treatment, we will call you to review the results.   Testing/Procedures: None ordered   Follow-Up: At [facility], you and your health needs are our priority.  As part of our continuing mission to provide you with exceptional heart care, we have created designated Provider Care Teams.  These Care Teams include your primary Cardiologist (physician) and Advanced Practice Providers (APPs -  Physician Assistants and Nurse Practitioners) who all work together to provide you with the care you need, when you need it.  We recommend signing up for the patient portal called "MyChart".  Sign up information is provided on this After Visit Summary.  MyChart is used to connect with patients for Virtual Visits (Telemedicine).  Patients are able to view lab/test results, encounter notes, upcoming appointments, etc.  Non-urgent messages can be sent to your provider as well.   To learn more about what you can do with MyChart, go to [email or web address].    Your physician recommends that you schedule a follow-up appointment in: 6 MONTHS with Dr. Bettina Gavia  Remote monitoring is used to monitor your Pacemaker from home. This monitoring reduces the number of office visits required to check your device to one time per year. It allows [place] to keep an eye on the functioning of your device to ensure it is working properly. You are scheduled for a device  check from home on 10/31/2020. You may send your transmission at any time that day. If you have a wireless device, the transmission will be sent automatically. After your physician reviews your transmission, you will receive a postcard with your next transmission date.  Your next appointment:   1 year(s)  The format for your next appointment:   In Person  Provider:   Allegra Lai, MD   Thank you for choosing [facility]!!   Trinidad Curet, RN [phone]

## 2020-08-06 NOTE — Progress Notes (Signed)
Electrophysiology Office Note   Date:  08/06/2020   ID:  Lawrence Braun, DOB [birth year], MRN [id number]  PCP:  Nicholos Johns, MD  Cardiologist:  Bettina Gavia Primary Electrophysiologist:  Fadumo Heng Meredith Leeds, MD    No chief complaint on file.    History of Present Illness: Lawrence Braun is a 74 y.o. male who is being seen today for the evaluation of atrial fibrillation at the request of Shirlee More. Presenting today for electrophysiology evaluation.    He has a history of skull fracture in 2014 with subsequent traumatic brain injury, subdural hematoma, subarachnoid hemorrhage, and paroxysmal atrial fibrillation.  He has anemia.  He has sick sinus syndrome and is status post Medtronic dual-chamber pacemaker.  Today, denies symptoms of palpitations, chest pain, shortness of breath, orthopnea, PND, lower extremity edema, claudication, dizziness, presyncope, syncope, bleeding, or neurologic sequela. The patient is tolerating medications without difficulties.  Since last being seen, he continues to have short episodes of atrial fibrillation.  His episodes last up to 2 hours at a time.  He has a 0.2% atrial fibrillation burden noted on his cardiac monitor.  When he is in atrial fibrillation, according to his wife, he has quite a bit of fatigue and shortness of breath.  His wife does state that his mental status has been declining and he can follow some 3 word commands.   Past Medical History:  Diagnosis Date  . Allergy   . Anxiety   . Arm weakness 06/01/2013  . Arthritis   . Asthma   . Atrial flutter ([place])   . Balance problem 06/01/2013  . Bradycardia 10/31/2015  . Cardiac pacemaker in situ 02/22/2015  . Depression   . Episode of altered cognition 11/13/2012  . Fracture, skull ([place]) 02/28/2013   Overview:  occiptial, nondisplaced  . Generalized anxiety disorder 01/12/2014  . Glaucoma   . Headache 11/13/2012  . Hematoma 11/06/2012  . Hyperlipidemia   . Loss of hearing    rt ear after fall    . MDD (major depressive disorder), recurrent episode ([place]) 10/05/2013  . On amiodarone therapy 02/22/2015  . Pacemaker reprogramming/check 10/31/2015  . PAF (paroxysmal atrial fibrillation) ([place]) 02/22/2015   Overview:  CHADS2=1  . Partial epilepsy with impairment of consciousness, not intractable ([place]) 11/05/2015  . Sacral fracture ([place]) 11/06/2012   after falling on ice  . SDH (subdural hematoma) ([place]) 11/17/2012  . Seizures ([place]) 05/23/2015  . SSS (sick sinus syndrome) ([place]) 05/15/2017  . Subarachnoid hemorrhage ([place]) 11/06/2012   after falling on ice  . Subdural hematoma ([place]) 11/06/2012   after falling on ice  . TBI (traumatic brain injury) ([place]) 10/05/2013  . Traumatic subarachnoid hemorrhage without loss of consciousness ([place]) 02/28/2013  . Tremor 05/15/2017   Past Surgical History:  Procedure Laterality Date  . INGUINAL HERNIA REPAIR Right 1998  . LUMBAR LAMINECTOMY  1970     Current Outpatient Medications  Medication Sig Dispense Refill  . amiodarone (PACERONE) 200 MG tablet Take 1 tablet (200 mg total) by mouth daily. 90 tablet 3  . aspirin 81 MG tablet Take 81 mg by mouth daily.    . betaxolol (BETOPTIC-S) 0.5 % ophthalmic suspension Place 1 drop into both eyes daily.    . Calcium Carbonate-Vitamin D (CALCIUM + D PO) Take 1 tablet by mouth daily.     . Cholecalciferol (VITAMIN D PO) Take 1 tablet by mouth daily.     . fluticasone (FLONASE) 50 MCG/ACT nasal spray Place 2 sprays into both  nostrils daily.     Marland Kitchen LORazepam (ATIVAN) 0.5 MG tablet Take 0.5 mg by mouth 2 (two) times daily.    . meloxicam (MOBIC) 7.5 MG tablet Take 7.5 mg by mouth daily.    . montelukast (SINGULAIR) 10 MG tablet Take 10 mg by mouth daily.    . Multiple Vitamin (MULTIVITAMIN) tablet Take 1 tablet by mouth daily.    . Psyllium (METAMUCIL PO) Take by mouth daily.    . risperiDONE (RISPERDAL) 0.5 MG tablet Take 0.5 mg by mouth 2 (two) times daily.    . Sertraline HCl 150 MG CAPS Take 1 capsule by mouth daily.     . tamsulosin (FLOMAX) 0.4 MG CAPS capsule Take 0.4 mg by mouth daily.    Marland Kitchen VIMPAT 100 MG TABS Take 1 tablet by mouth 2 (two) times daily.     No current facility-administered medications for this visit.    Allergies:   Tramadol   Social History:  The patient  reports that he has never smoked. He has never used smokeless tobacco. He reports current alcohol use. He reports that he does not use drugs.   Family History:  The patient's family history includes Colon cancer (age of onset: 67) in his brother; Heart attack in his father; Stroke in his mother.   ROS:  Please see the history of present illness.   Otherwise, review of systems is positive for none.   All other systems are reviewed and negative.   PHYSICAL EXAM: VS:  BP 110/70   Pulse 69   Ht 6\' 3"  (1.905 m)   SpO2 98%   BMI 24.12 kg/m  , BMI Body mass index is 24.12 kg/m. GEN: Well nourished, well developed, in no acute distress  HEENT: normal  Neck: no JVD, carotid bruits, or masses Cardiac: RRR; no murmurs, rubs, or gallops,no edema  Respiratory:  clear to auscultation bilaterally, normal work of breathing GI: soft, nontender, nondistended, + BS MS: no deformity or atrophy  Skin: warm and dry, device site well healed Neuro:  Strength and sensation are intact Psych: euthymic mood, full affect  EKG:  EKG is not ordered today. Personal review of the ekg ordered 02/21/20 shows atrial paced, rate 61  Personal review of the device interrogation today. Results in [place]: No results found for requested labs within last 8760 hours.    Lipid Panel     Component Value Date/Time   CHOL 188 03/14/2019 1133   TRIG 87 03/14/2019 1133   HDL 58 03/14/2019 1133   CHOLHDL 3.2 03/14/2019 1133   LDLCALC 113 (H) 03/14/2019 1133     Wt Readings from Last 3 Encounters:  02/21/20 193 lb (87.5 kg)  08/08/19 194 lb (88 kg)  03/14/19 186 lb 3.2 oz (84.5 kg)      Other studies Reviewed: Additional studies/  records that were reviewed today include: TTE 2014 Review of the above records today demonstrates:  There is no comparison study available. The left ventricular size is normal. No segmental wall motion abnormalities seen in the left ventricle The right ventricle is not well visualized. The left atrium is mildly dilated. The right atrium is moderately dilated. The aortic valve is normal in structure and function. There is mild mitral regurgitation. There is mild tricuspid regurgitation. Mild pulmonary hypertension. There is no pulmonic valvular regurgitation. Trivial pericardial effusion. Consider cardiac MRI for further assessment of right heart function.   ASSESSMENT AND PLAN:  1.  Paroxysmal atrial fibrillation/atrial flutter: Currently  on aspirin and amiodarone, high risk medication monitoring.  Has had a prior brain bleed.  CHA2DS2-VASc of 2.  He has a 0.2% atrial fibrillation burden.  Lawrence Braun check amiodarone labs today.   2.  Sinus syndrome: Status post Medtronic dual-chamber pacemaker.  Device functioning appropriately.  No changes.    Case discussed with primary cardiology  Current medicines are reviewed at length with the patient today.   The patient does not have concerns regarding his medicines.  The following changes were made today: None  Labs/ tests ordered today include:  Orders Placed This Encounter  Procedures  . TSH  . Hepatic function panel     Disposition:   FU with Lawrence Braun 1 year  Signed, Lawrence Traywick Meredith Leeds, MD  08/06/2020 3:19 PM     [facility] [address] [place] [address] [phone] (office) [phone] (fax)

## 2020-08-07 LAB — HEPATIC FUNCTION PANEL
ALT: 18 [IU]/L (ref 0–44)
AST: 17 [IU]/L (ref 0–40)
Albumin: 4.2 g/dL (ref 3.7–4.7)
Alkaline Phosphatase: 123 [IU]/L — ABNORMAL HIGH (ref 44–121)
Bilirubin Total: 0.3 mg/dL (ref 0.0–1.2)
Bilirubin, Direct: <0.1 mg/dL (ref 0.00–0.40)
Total Protein: 7 g/dL (ref 6.0–8.5)

## 2020-08-07 LAB — TSH: TSH: 4.05 u[IU]/mL (ref 0.450–4.500)

## 2020-08-08 NOTE — Progress Notes (Signed)
Remote pacemaker transmission.   

## 2020-08-09 ENCOUNTER — Ambulatory Visit: Payer: Medicare Other | Admitting: Cardiology

## 2020-08-17 DIAGNOSIS — N319 Neuromuscular dysfunction of bladder, unspecified: Secondary | ICD-10-CM | POA: Diagnosis not present

## 2020-08-17 DIAGNOSIS — N3289 Other specified disorders of bladder: Secondary | ICD-10-CM | POA: Diagnosis not present

## 2020-08-17 DIAGNOSIS — N401 Enlarged prostate with lower urinary tract symptoms: Secondary | ICD-10-CM | POA: Diagnosis not present

## 2020-09-28 DIAGNOSIS — F339 Major depressive disorder, recurrent, unspecified: Secondary | ICD-10-CM | POA: Diagnosis not present

## 2020-09-28 DIAGNOSIS — F064 Anxiety disorder due to known physiological condition: Secondary | ICD-10-CM | POA: Diagnosis not present

## 2020-09-28 DIAGNOSIS — F0281 Dementia in other diseases classified elsewhere with behavioral disturbance: Secondary | ICD-10-CM | POA: Diagnosis not present

## 2020-10-30 DIAGNOSIS — N390 Urinary tract infection, site not specified: Secondary | ICD-10-CM | POA: Diagnosis not present

## 2020-10-31 ENCOUNTER — Ambulatory Visit (INDEPENDENT_AMBULATORY_CARE_PROVIDER_SITE_OTHER): Payer: Medicare Other

## 2020-10-31 DIAGNOSIS — I495 Sick sinus syndrome: Secondary | ICD-10-CM | POA: Diagnosis not present

## 2020-11-02 LAB — CUP PACEART REMOTE DEVICE CHECK
Battery Remaining Longevity: 46 mo
Battery Voltage: 2.98 V
Brady Statistic AP VP Percent: 1.72 %
Brady Statistic AP VS Percent: 62.46 %
Brady Statistic AS VP Percent: 8.47 %
Brady Statistic AS VS Percent: 27.35 %
Brady Statistic RA Percent Paced: 63.28 %
Brady Statistic RV Percent Paced: 9.85 %
Date Time Interrogation Session: 20220302171657
Implantable Lead Implant Date: 20151029
Implantable Lead Implant Date: 20151029
Implantable Lead Location: 753859
Implantable Lead Location: 753860
Implantable Lead Model: 5076
Implantable Lead Model: 5076
Implantable Pulse Generator Implant Date: 20151029
Lead Channel Impedance Value: 323 Ohm
Lead Channel Impedance Value: 380 Ohm
Lead Channel Impedance Value: 494 Ohm
Lead Channel Impedance Value: 532 Ohm
Lead Channel Pacing Threshold Amplitude: 0.5 V
Lead Channel Pacing Threshold Amplitude: 0.625 V
Lead Channel Pacing Threshold Pulse Width: 0.4 ms
Lead Channel Pacing Threshold Pulse Width: 0.4 ms
Lead Channel Sensing Intrinsic Amplitude: 10.5 mV
Lead Channel Sensing Intrinsic Amplitude: 10.5 mV
Lead Channel Sensing Intrinsic Amplitude: 2.375 mV
Lead Channel Sensing Intrinsic Amplitude: 2.375 mV
Lead Channel Setting Pacing Amplitude: 1.5 V
Lead Channel Setting Pacing Amplitude: 2 V
Lead Channel Setting Pacing Pulse Width: 0.4 ms
Lead Channel Setting Sensing Sensitivity: 0.9 mV

## 2020-11-08 NOTE — Progress Notes (Signed)
Remote pacemaker transmission.   

## 2020-11-12 DIAGNOSIS — E785 Hyperlipidemia, unspecified: Secondary | ICD-10-CM | POA: Diagnosis not present

## 2020-11-12 DIAGNOSIS — F418 Other specified anxiety disorders: Secondary | ICD-10-CM | POA: Diagnosis not present

## 2020-11-12 DIAGNOSIS — J309 Allergic rhinitis, unspecified: Secondary | ICD-10-CM | POA: Diagnosis not present

## 2020-11-12 DIAGNOSIS — G250 Essential tremor: Secondary | ICD-10-CM | POA: Diagnosis not present

## 2020-11-12 DIAGNOSIS — E559 Vitamin D deficiency, unspecified: Secondary | ICD-10-CM | POA: Diagnosis not present

## 2020-11-12 DIAGNOSIS — G40909 Epilepsy, unspecified, not intractable, without status epilepticus: Secondary | ICD-10-CM | POA: Diagnosis not present

## 2020-11-12 DIAGNOSIS — R413 Other amnesia: Secondary | ICD-10-CM | POA: Diagnosis not present

## 2020-11-12 DIAGNOSIS — I495 Sick sinus syndrome: Secondary | ICD-10-CM | POA: Diagnosis not present

## 2020-11-12 DIAGNOSIS — I4892 Unspecified atrial flutter: Secondary | ICD-10-CM | POA: Diagnosis not present

## 2020-11-12 DIAGNOSIS — N390 Urinary tract infection, site not specified: Secondary | ICD-10-CM | POA: Diagnosis not present

## 2020-11-12 DIAGNOSIS — I10 Essential (primary) hypertension: Secondary | ICD-10-CM | POA: Diagnosis not present

## 2020-11-12 DIAGNOSIS — Z6824 Body mass index (BMI) 24.0-24.9, adult: Secondary | ICD-10-CM | POA: Diagnosis not present

## 2020-11-13 DIAGNOSIS — I4891 Unspecified atrial fibrillation: Secondary | ICD-10-CM | POA: Diagnosis not present

## 2020-11-13 DIAGNOSIS — S069X9S Unspecified intracranial injury with loss of consciousness of unspecified duration, sequela: Secondary | ICD-10-CM | POA: Diagnosis not present

## 2020-11-13 DIAGNOSIS — G40209 Localization-related (focal) (partial) symptomatic epilepsy and epileptic syndromes with complex partial seizures, not intractable, without status epilepticus: Secondary | ICD-10-CM | POA: Diagnosis not present

## 2020-11-13 DIAGNOSIS — R4189 Other symptoms and signs involving cognitive functions and awareness: Secondary | ICD-10-CM | POA: Diagnosis not present

## 2020-11-19 DIAGNOSIS — F0281 Dementia in other diseases classified elsewhere with behavioral disturbance: Secondary | ICD-10-CM | POA: Diagnosis not present

## 2020-11-19 DIAGNOSIS — F339 Major depressive disorder, recurrent, unspecified: Secondary | ICD-10-CM | POA: Diagnosis not present

## 2020-11-19 DIAGNOSIS — F064 Anxiety disorder due to known physiological condition: Secondary | ICD-10-CM | POA: Diagnosis not present

## 2020-11-20 DIAGNOSIS — S066X0S Traumatic subarachnoid hemorrhage without loss of consciousness, sequela: Secondary | ICD-10-CM | POA: Diagnosis not present

## 2020-11-20 DIAGNOSIS — H409 Unspecified glaucoma: Secondary | ICD-10-CM | POA: Diagnosis not present

## 2020-11-20 DIAGNOSIS — F419 Anxiety disorder, unspecified: Secondary | ICD-10-CM | POA: Diagnosis not present

## 2020-11-20 DIAGNOSIS — N400 Enlarged prostate without lower urinary tract symptoms: Secondary | ICD-10-CM | POA: Diagnosis not present

## 2020-11-20 DIAGNOSIS — S065X0S Traumatic subdural hemorrhage without loss of consciousness, sequela: Secondary | ICD-10-CM | POA: Diagnosis not present

## 2020-11-20 DIAGNOSIS — R001 Bradycardia, unspecified: Secondary | ICD-10-CM | POA: Diagnosis not present

## 2020-11-20 DIAGNOSIS — I10 Essential (primary) hypertension: Secondary | ICD-10-CM | POA: Diagnosis not present

## 2020-11-20 DIAGNOSIS — F0390 Unspecified dementia without behavioral disturbance: Secondary | ICD-10-CM | POA: Diagnosis not present

## 2020-11-20 DIAGNOSIS — J302 Other seasonal allergic rhinitis: Secondary | ICD-10-CM | POA: Diagnosis not present

## 2020-11-20 DIAGNOSIS — M1990 Unspecified osteoarthritis, unspecified site: Secondary | ICD-10-CM | POA: Diagnosis not present

## 2020-11-20 DIAGNOSIS — I2510 Atherosclerotic heart disease of native coronary artery without angina pectoris: Secondary | ICD-10-CM | POA: Diagnosis not present

## 2020-11-20 DIAGNOSIS — R634 Abnormal weight loss: Secondary | ICD-10-CM | POA: Diagnosis not present

## 2020-11-20 DIAGNOSIS — Z950 Presence of cardiac pacemaker: Secondary | ICD-10-CM | POA: Diagnosis not present

## 2020-11-20 DIAGNOSIS — R41844 Frontal lobe and executive function deficit: Secondary | ICD-10-CM | POA: Diagnosis not present

## 2020-11-20 DIAGNOSIS — G40209 Localization-related (focal) (partial) symptomatic epilepsy and epileptic syndromes with complex partial seizures, not intractable, without status epilepticus: Secondary | ICD-10-CM | POA: Diagnosis not present

## 2020-11-20 DIAGNOSIS — R1310 Dysphagia, unspecified: Secondary | ICD-10-CM | POA: Diagnosis not present

## 2020-11-20 DIAGNOSIS — F32A Depression, unspecified: Secondary | ICD-10-CM | POA: Diagnosis not present

## 2020-11-20 DIAGNOSIS — I480 Paroxysmal atrial fibrillation: Secondary | ICD-10-CM | POA: Diagnosis not present

## 2020-11-20 DIAGNOSIS — R410 Disorientation, unspecified: Secondary | ICD-10-CM | POA: Diagnosis not present

## 2020-11-20 DIAGNOSIS — Z87440 Personal history of urinary (tract) infections: Secondary | ICD-10-CM | POA: Diagnosis not present

## 2020-11-21 DIAGNOSIS — S066X0S Traumatic subarachnoid hemorrhage without loss of consciousness, sequela: Secondary | ICD-10-CM | POA: Diagnosis not present

## 2020-11-21 DIAGNOSIS — G40209 Localization-related (focal) (partial) symptomatic epilepsy and epileptic syndromes with complex partial seizures, not intractable, without status epilepticus: Secondary | ICD-10-CM | POA: Diagnosis not present

## 2020-11-21 DIAGNOSIS — R1310 Dysphagia, unspecified: Secondary | ICD-10-CM | POA: Diagnosis not present

## 2020-11-21 DIAGNOSIS — R41844 Frontal lobe and executive function deficit: Secondary | ICD-10-CM | POA: Diagnosis not present

## 2020-11-21 DIAGNOSIS — S065X0S Traumatic subdural hemorrhage without loss of consciousness, sequela: Secondary | ICD-10-CM | POA: Diagnosis not present

## 2020-11-21 DIAGNOSIS — Z87440 Personal history of urinary (tract) infections: Secondary | ICD-10-CM | POA: Diagnosis not present

## 2020-11-22 DIAGNOSIS — Z87440 Personal history of urinary (tract) infections: Secondary | ICD-10-CM | POA: Diagnosis not present

## 2020-11-22 DIAGNOSIS — G40209 Localization-related (focal) (partial) symptomatic epilepsy and epileptic syndromes with complex partial seizures, not intractable, without status epilepticus: Secondary | ICD-10-CM | POA: Diagnosis not present

## 2020-11-22 DIAGNOSIS — S065X0S Traumatic subdural hemorrhage without loss of consciousness, sequela: Secondary | ICD-10-CM | POA: Diagnosis not present

## 2020-11-22 DIAGNOSIS — S066X0S Traumatic subarachnoid hemorrhage without loss of consciousness, sequela: Secondary | ICD-10-CM | POA: Diagnosis not present

## 2020-11-22 DIAGNOSIS — R1310 Dysphagia, unspecified: Secondary | ICD-10-CM | POA: Diagnosis not present

## 2020-11-22 DIAGNOSIS — R41844 Frontal lobe and executive function deficit: Secondary | ICD-10-CM | POA: Diagnosis not present

## 2020-11-23 DIAGNOSIS — S065X0S Traumatic subdural hemorrhage without loss of consciousness, sequela: Secondary | ICD-10-CM | POA: Diagnosis not present

## 2020-11-23 DIAGNOSIS — G40209 Localization-related (focal) (partial) symptomatic epilepsy and epileptic syndromes with complex partial seizures, not intractable, without status epilepticus: Secondary | ICD-10-CM | POA: Diagnosis not present

## 2020-11-23 DIAGNOSIS — R41844 Frontal lobe and executive function deficit: Secondary | ICD-10-CM | POA: Diagnosis not present

## 2020-11-23 DIAGNOSIS — Z6823 Body mass index (BMI) 23.0-23.9, adult: Secondary | ICD-10-CM | POA: Diagnosis not present

## 2020-11-23 DIAGNOSIS — F419 Anxiety disorder, unspecified: Secondary | ICD-10-CM | POA: Diagnosis not present

## 2020-11-23 DIAGNOSIS — Z87440 Personal history of urinary (tract) infections: Secondary | ICD-10-CM | POA: Diagnosis not present

## 2020-11-23 DIAGNOSIS — R1310 Dysphagia, unspecified: Secondary | ICD-10-CM | POA: Diagnosis not present

## 2020-11-23 DIAGNOSIS — G4700 Insomnia, unspecified: Secondary | ICD-10-CM | POA: Diagnosis not present

## 2020-11-23 DIAGNOSIS — S066X0S Traumatic subarachnoid hemorrhage without loss of consciousness, sequela: Secondary | ICD-10-CM | POA: Diagnosis not present

## 2020-11-26 DIAGNOSIS — G40209 Localization-related (focal) (partial) symptomatic epilepsy and epileptic syndromes with complex partial seizures, not intractable, without status epilepticus: Secondary | ICD-10-CM | POA: Diagnosis not present

## 2020-11-26 DIAGNOSIS — R41844 Frontal lobe and executive function deficit: Secondary | ICD-10-CM | POA: Diagnosis not present

## 2020-11-26 DIAGNOSIS — S065X0S Traumatic subdural hemorrhage without loss of consciousness, sequela: Secondary | ICD-10-CM | POA: Diagnosis not present

## 2020-11-26 DIAGNOSIS — Z87440 Personal history of urinary (tract) infections: Secondary | ICD-10-CM | POA: Diagnosis not present

## 2020-11-26 DIAGNOSIS — R1310 Dysphagia, unspecified: Secondary | ICD-10-CM | POA: Diagnosis not present

## 2020-11-26 DIAGNOSIS — S066X0S Traumatic subarachnoid hemorrhage without loss of consciousness, sequela: Secondary | ICD-10-CM | POA: Diagnosis not present

## 2020-11-28 DIAGNOSIS — Z87440 Personal history of urinary (tract) infections: Secondary | ICD-10-CM | POA: Diagnosis not present

## 2020-11-28 DIAGNOSIS — R41844 Frontal lobe and executive function deficit: Secondary | ICD-10-CM | POA: Diagnosis not present

## 2020-11-28 DIAGNOSIS — R1310 Dysphagia, unspecified: Secondary | ICD-10-CM | POA: Diagnosis not present

## 2020-11-28 DIAGNOSIS — S066X0S Traumatic subarachnoid hemorrhage without loss of consciousness, sequela: Secondary | ICD-10-CM | POA: Diagnosis not present

## 2020-11-28 DIAGNOSIS — G40209 Localization-related (focal) (partial) symptomatic epilepsy and epileptic syndromes with complex partial seizures, not intractable, without status epilepticus: Secondary | ICD-10-CM | POA: Diagnosis not present

## 2020-11-28 DIAGNOSIS — S065X0S Traumatic subdural hemorrhage without loss of consciousness, sequela: Secondary | ICD-10-CM | POA: Diagnosis not present

## 2020-11-30 DIAGNOSIS — S066X0S Traumatic subarachnoid hemorrhage without loss of consciousness, sequela: Secondary | ICD-10-CM | POA: Diagnosis not present

## 2020-11-30 DIAGNOSIS — I2510 Atherosclerotic heart disease of native coronary artery without angina pectoris: Secondary | ICD-10-CM | POA: Diagnosis not present

## 2020-11-30 DIAGNOSIS — J302 Other seasonal allergic rhinitis: Secondary | ICD-10-CM | POA: Diagnosis not present

## 2020-11-30 DIAGNOSIS — G40209 Localization-related (focal) (partial) symptomatic epilepsy and epileptic syndromes with complex partial seizures, not intractable, without status epilepticus: Secondary | ICD-10-CM | POA: Diagnosis not present

## 2020-11-30 DIAGNOSIS — R41844 Frontal lobe and executive function deficit: Secondary | ICD-10-CM | POA: Diagnosis not present

## 2020-11-30 DIAGNOSIS — R410 Disorientation, unspecified: Secondary | ICD-10-CM | POA: Diagnosis not present

## 2020-11-30 DIAGNOSIS — F419 Anxiety disorder, unspecified: Secondary | ICD-10-CM | POA: Diagnosis not present

## 2020-11-30 DIAGNOSIS — N400 Enlarged prostate without lower urinary tract symptoms: Secondary | ICD-10-CM | POA: Diagnosis not present

## 2020-11-30 DIAGNOSIS — F32A Depression, unspecified: Secondary | ICD-10-CM | POA: Diagnosis not present

## 2020-11-30 DIAGNOSIS — S065X0S Traumatic subdural hemorrhage without loss of consciousness, sequela: Secondary | ICD-10-CM | POA: Diagnosis not present

## 2020-11-30 DIAGNOSIS — H409 Unspecified glaucoma: Secondary | ICD-10-CM | POA: Diagnosis not present

## 2020-11-30 DIAGNOSIS — R1310 Dysphagia, unspecified: Secondary | ICD-10-CM | POA: Diagnosis not present

## 2020-11-30 DIAGNOSIS — Z87440 Personal history of urinary (tract) infections: Secondary | ICD-10-CM | POA: Diagnosis not present

## 2020-11-30 DIAGNOSIS — Z950 Presence of cardiac pacemaker: Secondary | ICD-10-CM | POA: Diagnosis not present

## 2020-11-30 DIAGNOSIS — F0390 Unspecified dementia without behavioral disturbance: Secondary | ICD-10-CM | POA: Diagnosis not present

## 2020-11-30 DIAGNOSIS — M1990 Unspecified osteoarthritis, unspecified site: Secondary | ICD-10-CM | POA: Diagnosis not present

## 2020-11-30 DIAGNOSIS — I480 Paroxysmal atrial fibrillation: Secondary | ICD-10-CM | POA: Diagnosis not present

## 2020-11-30 DIAGNOSIS — R634 Abnormal weight loss: Secondary | ICD-10-CM | POA: Diagnosis not present

## 2020-11-30 DIAGNOSIS — I10 Essential (primary) hypertension: Secondary | ICD-10-CM | POA: Diagnosis not present

## 2020-11-30 DIAGNOSIS — R001 Bradycardia, unspecified: Secondary | ICD-10-CM | POA: Diagnosis not present

## 2020-12-03 DIAGNOSIS — G40209 Localization-related (focal) (partial) symptomatic epilepsy and epileptic syndromes with complex partial seizures, not intractable, without status epilepticus: Secondary | ICD-10-CM | POA: Diagnosis not present

## 2020-12-03 DIAGNOSIS — Z87440 Personal history of urinary (tract) infections: Secondary | ICD-10-CM | POA: Diagnosis not present

## 2020-12-03 DIAGNOSIS — R41844 Frontal lobe and executive function deficit: Secondary | ICD-10-CM | POA: Diagnosis not present

## 2020-12-03 DIAGNOSIS — R1310 Dysphagia, unspecified: Secondary | ICD-10-CM | POA: Diagnosis not present

## 2020-12-03 DIAGNOSIS — S065X0S Traumatic subdural hemorrhage without loss of consciousness, sequela: Secondary | ICD-10-CM | POA: Diagnosis not present

## 2020-12-03 DIAGNOSIS — S066X0S Traumatic subarachnoid hemorrhage without loss of consciousness, sequela: Secondary | ICD-10-CM | POA: Diagnosis not present

## 2020-12-05 DIAGNOSIS — R41844 Frontal lobe and executive function deficit: Secondary | ICD-10-CM | POA: Diagnosis not present

## 2020-12-05 DIAGNOSIS — R1310 Dysphagia, unspecified: Secondary | ICD-10-CM | POA: Diagnosis not present

## 2020-12-05 DIAGNOSIS — Z87440 Personal history of urinary (tract) infections: Secondary | ICD-10-CM | POA: Diagnosis not present

## 2020-12-05 DIAGNOSIS — S065X0S Traumatic subdural hemorrhage without loss of consciousness, sequela: Secondary | ICD-10-CM | POA: Diagnosis not present

## 2020-12-05 DIAGNOSIS — S066X0S Traumatic subarachnoid hemorrhage without loss of consciousness, sequela: Secondary | ICD-10-CM | POA: Diagnosis not present

## 2020-12-05 DIAGNOSIS — G40209 Localization-related (focal) (partial) symptomatic epilepsy and epileptic syndromes with complex partial seizures, not intractable, without status epilepticus: Secondary | ICD-10-CM | POA: Diagnosis not present

## 2020-12-07 DIAGNOSIS — Z87440 Personal history of urinary (tract) infections: Secondary | ICD-10-CM | POA: Diagnosis not present

## 2020-12-07 DIAGNOSIS — R1310 Dysphagia, unspecified: Secondary | ICD-10-CM | POA: Diagnosis not present

## 2020-12-07 DIAGNOSIS — S065X0S Traumatic subdural hemorrhage without loss of consciousness, sequela: Secondary | ICD-10-CM | POA: Diagnosis not present

## 2020-12-07 DIAGNOSIS — S066X0S Traumatic subarachnoid hemorrhage without loss of consciousness, sequela: Secondary | ICD-10-CM | POA: Diagnosis not present

## 2020-12-07 DIAGNOSIS — R41844 Frontal lobe and executive function deficit: Secondary | ICD-10-CM | POA: Diagnosis not present

## 2020-12-07 DIAGNOSIS — G40209 Localization-related (focal) (partial) symptomatic epilepsy and epileptic syndromes with complex partial seizures, not intractable, without status epilepticus: Secondary | ICD-10-CM | POA: Diagnosis not present

## 2020-12-10 DIAGNOSIS — G40209 Localization-related (focal) (partial) symptomatic epilepsy and epileptic syndromes with complex partial seizures, not intractable, without status epilepticus: Secondary | ICD-10-CM | POA: Diagnosis not present

## 2020-12-10 DIAGNOSIS — S066X0S Traumatic subarachnoid hemorrhage without loss of consciousness, sequela: Secondary | ICD-10-CM | POA: Diagnosis not present

## 2020-12-10 DIAGNOSIS — S065X0S Traumatic subdural hemorrhage without loss of consciousness, sequela: Secondary | ICD-10-CM | POA: Diagnosis not present

## 2020-12-10 DIAGNOSIS — R41844 Frontal lobe and executive function deficit: Secondary | ICD-10-CM | POA: Diagnosis not present

## 2020-12-10 DIAGNOSIS — R1310 Dysphagia, unspecified: Secondary | ICD-10-CM | POA: Diagnosis not present

## 2020-12-10 DIAGNOSIS — Z87440 Personal history of urinary (tract) infections: Secondary | ICD-10-CM | POA: Diagnosis not present

## 2020-12-12 DIAGNOSIS — R1310 Dysphagia, unspecified: Secondary | ICD-10-CM | POA: Diagnosis not present

## 2020-12-12 DIAGNOSIS — S065X0S Traumatic subdural hemorrhage without loss of consciousness, sequela: Secondary | ICD-10-CM | POA: Diagnosis not present

## 2020-12-12 DIAGNOSIS — S066X0S Traumatic subarachnoid hemorrhage without loss of consciousness, sequela: Secondary | ICD-10-CM | POA: Diagnosis not present

## 2020-12-12 DIAGNOSIS — Z87440 Personal history of urinary (tract) infections: Secondary | ICD-10-CM | POA: Diagnosis not present

## 2020-12-12 DIAGNOSIS — G40209 Localization-related (focal) (partial) symptomatic epilepsy and epileptic syndromes with complex partial seizures, not intractable, without status epilepticus: Secondary | ICD-10-CM | POA: Diagnosis not present

## 2020-12-12 DIAGNOSIS — R41844 Frontal lobe and executive function deficit: Secondary | ICD-10-CM | POA: Diagnosis not present

## 2020-12-14 DIAGNOSIS — Z87440 Personal history of urinary (tract) infections: Secondary | ICD-10-CM | POA: Diagnosis not present

## 2020-12-14 DIAGNOSIS — G40209 Localization-related (focal) (partial) symptomatic epilepsy and epileptic syndromes with complex partial seizures, not intractable, without status epilepticus: Secondary | ICD-10-CM | POA: Diagnosis not present

## 2020-12-14 DIAGNOSIS — R41844 Frontal lobe and executive function deficit: Secondary | ICD-10-CM | POA: Diagnosis not present

## 2020-12-14 DIAGNOSIS — S066X0S Traumatic subarachnoid hemorrhage without loss of consciousness, sequela: Secondary | ICD-10-CM | POA: Diagnosis not present

## 2020-12-14 DIAGNOSIS — S065X0S Traumatic subdural hemorrhage without loss of consciousness, sequela: Secondary | ICD-10-CM | POA: Diagnosis not present

## 2020-12-14 DIAGNOSIS — R1310 Dysphagia, unspecified: Secondary | ICD-10-CM | POA: Diagnosis not present

## 2020-12-17 DIAGNOSIS — R1310 Dysphagia, unspecified: Secondary | ICD-10-CM | POA: Diagnosis not present

## 2020-12-17 DIAGNOSIS — R41844 Frontal lobe and executive function deficit: Secondary | ICD-10-CM | POA: Diagnosis not present

## 2020-12-17 DIAGNOSIS — Z87440 Personal history of urinary (tract) infections: Secondary | ICD-10-CM | POA: Diagnosis not present

## 2020-12-17 DIAGNOSIS — G40209 Localization-related (focal) (partial) symptomatic epilepsy and epileptic syndromes with complex partial seizures, not intractable, without status epilepticus: Secondary | ICD-10-CM | POA: Diagnosis not present

## 2020-12-17 DIAGNOSIS — S066X0S Traumatic subarachnoid hemorrhage without loss of consciousness, sequela: Secondary | ICD-10-CM | POA: Diagnosis not present

## 2020-12-17 DIAGNOSIS — S065X0S Traumatic subdural hemorrhage without loss of consciousness, sequela: Secondary | ICD-10-CM | POA: Diagnosis not present

## 2020-12-19 DIAGNOSIS — Z87440 Personal history of urinary (tract) infections: Secondary | ICD-10-CM | POA: Diagnosis not present

## 2020-12-19 DIAGNOSIS — G40209 Localization-related (focal) (partial) symptomatic epilepsy and epileptic syndromes with complex partial seizures, not intractable, without status epilepticus: Secondary | ICD-10-CM | POA: Diagnosis not present

## 2020-12-19 DIAGNOSIS — S065X0S Traumatic subdural hemorrhage without loss of consciousness, sequela: Secondary | ICD-10-CM | POA: Diagnosis not present

## 2020-12-19 DIAGNOSIS — R41844 Frontal lobe and executive function deficit: Secondary | ICD-10-CM | POA: Diagnosis not present

## 2020-12-19 DIAGNOSIS — R1310 Dysphagia, unspecified: Secondary | ICD-10-CM | POA: Diagnosis not present

## 2020-12-19 DIAGNOSIS — S066X0S Traumatic subarachnoid hemorrhage without loss of consciousness, sequela: Secondary | ICD-10-CM | POA: Diagnosis not present

## 2020-12-21 DIAGNOSIS — G40209 Localization-related (focal) (partial) symptomatic epilepsy and epileptic syndromes with complex partial seizures, not intractable, without status epilepticus: Secondary | ICD-10-CM | POA: Diagnosis not present

## 2020-12-21 DIAGNOSIS — Z87440 Personal history of urinary (tract) infections: Secondary | ICD-10-CM | POA: Diagnosis not present

## 2020-12-21 DIAGNOSIS — R41844 Frontal lobe and executive function deficit: Secondary | ICD-10-CM | POA: Diagnosis not present

## 2020-12-21 DIAGNOSIS — S066X0S Traumatic subarachnoid hemorrhage without loss of consciousness, sequela: Secondary | ICD-10-CM | POA: Diagnosis not present

## 2020-12-21 DIAGNOSIS — S065X0S Traumatic subdural hemorrhage without loss of consciousness, sequela: Secondary | ICD-10-CM | POA: Diagnosis not present

## 2020-12-21 DIAGNOSIS — R1310 Dysphagia, unspecified: Secondary | ICD-10-CM | POA: Diagnosis not present

## 2020-12-24 DIAGNOSIS — R41844 Frontal lobe and executive function deficit: Secondary | ICD-10-CM | POA: Diagnosis not present

## 2020-12-24 DIAGNOSIS — R1310 Dysphagia, unspecified: Secondary | ICD-10-CM | POA: Diagnosis not present

## 2020-12-24 DIAGNOSIS — S066X0S Traumatic subarachnoid hemorrhage without loss of consciousness, sequela: Secondary | ICD-10-CM | POA: Diagnosis not present

## 2020-12-24 DIAGNOSIS — G40209 Localization-related (focal) (partial) symptomatic epilepsy and epileptic syndromes with complex partial seizures, not intractable, without status epilepticus: Secondary | ICD-10-CM | POA: Diagnosis not present

## 2020-12-24 DIAGNOSIS — Z87440 Personal history of urinary (tract) infections: Secondary | ICD-10-CM | POA: Diagnosis not present

## 2020-12-24 DIAGNOSIS — S065X0S Traumatic subdural hemorrhage without loss of consciousness, sequela: Secondary | ICD-10-CM | POA: Diagnosis not present

## 2020-12-26 DIAGNOSIS — G40209 Localization-related (focal) (partial) symptomatic epilepsy and epileptic syndromes with complex partial seizures, not intractable, without status epilepticus: Secondary | ICD-10-CM | POA: Diagnosis not present

## 2020-12-26 DIAGNOSIS — S065X0S Traumatic subdural hemorrhage without loss of consciousness, sequela: Secondary | ICD-10-CM | POA: Diagnosis not present

## 2020-12-26 DIAGNOSIS — Z87440 Personal history of urinary (tract) infections: Secondary | ICD-10-CM | POA: Diagnosis not present

## 2020-12-26 DIAGNOSIS — R41844 Frontal lobe and executive function deficit: Secondary | ICD-10-CM | POA: Diagnosis not present

## 2020-12-26 DIAGNOSIS — R1310 Dysphagia, unspecified: Secondary | ICD-10-CM | POA: Diagnosis not present

## 2020-12-26 DIAGNOSIS — S066X0S Traumatic subarachnoid hemorrhage without loss of consciousness, sequela: Secondary | ICD-10-CM | POA: Diagnosis not present

## 2020-12-28 DIAGNOSIS — Z87440 Personal history of urinary (tract) infections: Secondary | ICD-10-CM | POA: Diagnosis not present

## 2020-12-28 DIAGNOSIS — R1310 Dysphagia, unspecified: Secondary | ICD-10-CM | POA: Diagnosis not present

## 2020-12-28 DIAGNOSIS — S065X0S Traumatic subdural hemorrhage without loss of consciousness, sequela: Secondary | ICD-10-CM | POA: Diagnosis not present

## 2020-12-28 DIAGNOSIS — G40209 Localization-related (focal) (partial) symptomatic epilepsy and epileptic syndromes with complex partial seizures, not intractable, without status epilepticus: Secondary | ICD-10-CM | POA: Diagnosis not present

## 2020-12-28 DIAGNOSIS — R41844 Frontal lobe and executive function deficit: Secondary | ICD-10-CM | POA: Diagnosis not present

## 2020-12-28 DIAGNOSIS — S066X0S Traumatic subarachnoid hemorrhage without loss of consciousness, sequela: Secondary | ICD-10-CM | POA: Diagnosis not present

## 2020-12-30 DIAGNOSIS — I10 Essential (primary) hypertension: Secondary | ICD-10-CM | POA: Diagnosis not present

## 2020-12-30 DIAGNOSIS — H409 Unspecified glaucoma: Secondary | ICD-10-CM | POA: Diagnosis not present

## 2020-12-30 DIAGNOSIS — F32A Depression, unspecified: Secondary | ICD-10-CM | POA: Diagnosis not present

## 2020-12-30 DIAGNOSIS — N400 Enlarged prostate without lower urinary tract symptoms: Secondary | ICD-10-CM | POA: Diagnosis not present

## 2020-12-30 DIAGNOSIS — R1310 Dysphagia, unspecified: Secondary | ICD-10-CM | POA: Diagnosis not present

## 2020-12-30 DIAGNOSIS — I480 Paroxysmal atrial fibrillation: Secondary | ICD-10-CM | POA: Diagnosis not present

## 2020-12-30 DIAGNOSIS — M1990 Unspecified osteoarthritis, unspecified site: Secondary | ICD-10-CM | POA: Diagnosis not present

## 2020-12-30 DIAGNOSIS — G40209 Localization-related (focal) (partial) symptomatic epilepsy and epileptic syndromes with complex partial seizures, not intractable, without status epilepticus: Secondary | ICD-10-CM | POA: Diagnosis not present

## 2020-12-30 DIAGNOSIS — Z950 Presence of cardiac pacemaker: Secondary | ICD-10-CM | POA: Diagnosis not present

## 2020-12-30 DIAGNOSIS — R41844 Frontal lobe and executive function deficit: Secondary | ICD-10-CM | POA: Diagnosis not present

## 2020-12-30 DIAGNOSIS — F419 Anxiety disorder, unspecified: Secondary | ICD-10-CM | POA: Diagnosis not present

## 2020-12-30 DIAGNOSIS — J302 Other seasonal allergic rhinitis: Secondary | ICD-10-CM | POA: Diagnosis not present

## 2020-12-30 DIAGNOSIS — S065X0S Traumatic subdural hemorrhage without loss of consciousness, sequela: Secondary | ICD-10-CM | POA: Diagnosis not present

## 2020-12-30 DIAGNOSIS — I2510 Atherosclerotic heart disease of native coronary artery without angina pectoris: Secondary | ICD-10-CM | POA: Diagnosis not present

## 2020-12-30 DIAGNOSIS — R410 Disorientation, unspecified: Secondary | ICD-10-CM | POA: Diagnosis not present

## 2020-12-30 DIAGNOSIS — F0390 Unspecified dementia without behavioral disturbance: Secondary | ICD-10-CM | POA: Diagnosis not present

## 2020-12-30 DIAGNOSIS — S066X0S Traumatic subarachnoid hemorrhage without loss of consciousness, sequela: Secondary | ICD-10-CM | POA: Diagnosis not present

## 2020-12-30 DIAGNOSIS — R634 Abnormal weight loss: Secondary | ICD-10-CM | POA: Diagnosis not present

## 2020-12-30 DIAGNOSIS — Z87440 Personal history of urinary (tract) infections: Secondary | ICD-10-CM | POA: Diagnosis not present

## 2020-12-30 DIAGNOSIS — R001 Bradycardia, unspecified: Secondary | ICD-10-CM | POA: Diagnosis not present

## 2020-12-31 DIAGNOSIS — G40209 Localization-related (focal) (partial) symptomatic epilepsy and epileptic syndromes with complex partial seizures, not intractable, without status epilepticus: Secondary | ICD-10-CM | POA: Diagnosis not present

## 2020-12-31 DIAGNOSIS — S066X0S Traumatic subarachnoid hemorrhage without loss of consciousness, sequela: Secondary | ICD-10-CM | POA: Diagnosis not present

## 2020-12-31 DIAGNOSIS — S065X0S Traumatic subdural hemorrhage without loss of consciousness, sequela: Secondary | ICD-10-CM | POA: Diagnosis not present

## 2020-12-31 DIAGNOSIS — R41844 Frontal lobe and executive function deficit: Secondary | ICD-10-CM | POA: Diagnosis not present

## 2020-12-31 DIAGNOSIS — Z87440 Personal history of urinary (tract) infections: Secondary | ICD-10-CM | POA: Diagnosis not present

## 2020-12-31 DIAGNOSIS — R1310 Dysphagia, unspecified: Secondary | ICD-10-CM | POA: Diagnosis not present

## 2021-01-01 DIAGNOSIS — F064 Anxiety disorder due to known physiological condition: Secondary | ICD-10-CM | POA: Diagnosis not present

## 2021-01-01 DIAGNOSIS — F0281 Dementia in other diseases classified elsewhere with behavioral disturbance: Secondary | ICD-10-CM | POA: Diagnosis not present

## 2021-01-01 DIAGNOSIS — F339 Major depressive disorder, recurrent, unspecified: Secondary | ICD-10-CM | POA: Diagnosis not present

## 2021-01-02 DIAGNOSIS — S065X0S Traumatic subdural hemorrhage without loss of consciousness, sequela: Secondary | ICD-10-CM | POA: Diagnosis not present

## 2021-01-02 DIAGNOSIS — Z87440 Personal history of urinary (tract) infections: Secondary | ICD-10-CM | POA: Diagnosis not present

## 2021-01-02 DIAGNOSIS — S066X0S Traumatic subarachnoid hemorrhage without loss of consciousness, sequela: Secondary | ICD-10-CM | POA: Diagnosis not present

## 2021-01-02 DIAGNOSIS — R1310 Dysphagia, unspecified: Secondary | ICD-10-CM | POA: Diagnosis not present

## 2021-01-02 DIAGNOSIS — R41844 Frontal lobe and executive function deficit: Secondary | ICD-10-CM | POA: Diagnosis not present

## 2021-01-02 DIAGNOSIS — G40209 Localization-related (focal) (partial) symptomatic epilepsy and epileptic syndromes with complex partial seizures, not intractable, without status epilepticus: Secondary | ICD-10-CM | POA: Diagnosis not present

## 2021-01-04 DIAGNOSIS — G40209 Localization-related (focal) (partial) symptomatic epilepsy and epileptic syndromes with complex partial seizures, not intractable, without status epilepticus: Secondary | ICD-10-CM | POA: Diagnosis not present

## 2021-01-04 DIAGNOSIS — S065X0S Traumatic subdural hemorrhage without loss of consciousness, sequela: Secondary | ICD-10-CM | POA: Diagnosis not present

## 2021-01-04 DIAGNOSIS — R41844 Frontal lobe and executive function deficit: Secondary | ICD-10-CM | POA: Diagnosis not present

## 2021-01-04 DIAGNOSIS — S066X0S Traumatic subarachnoid hemorrhage without loss of consciousness, sequela: Secondary | ICD-10-CM | POA: Diagnosis not present

## 2021-01-04 DIAGNOSIS — Z87440 Personal history of urinary (tract) infections: Secondary | ICD-10-CM | POA: Diagnosis not present

## 2021-01-04 DIAGNOSIS — R1310 Dysphagia, unspecified: Secondary | ICD-10-CM | POA: Diagnosis not present

## 2021-01-07 DIAGNOSIS — G40209 Localization-related (focal) (partial) symptomatic epilepsy and epileptic syndromes with complex partial seizures, not intractable, without status epilepticus: Secondary | ICD-10-CM | POA: Diagnosis not present

## 2021-01-07 DIAGNOSIS — Z87440 Personal history of urinary (tract) infections: Secondary | ICD-10-CM | POA: Diagnosis not present

## 2021-01-07 DIAGNOSIS — R1310 Dysphagia, unspecified: Secondary | ICD-10-CM | POA: Diagnosis not present

## 2021-01-07 DIAGNOSIS — R41844 Frontal lobe and executive function deficit: Secondary | ICD-10-CM | POA: Diagnosis not present

## 2021-01-07 DIAGNOSIS — S066X0S Traumatic subarachnoid hemorrhage without loss of consciousness, sequela: Secondary | ICD-10-CM | POA: Diagnosis not present

## 2021-01-07 DIAGNOSIS — S065X0S Traumatic subdural hemorrhage without loss of consciousness, sequela: Secondary | ICD-10-CM | POA: Diagnosis not present

## 2021-01-09 DIAGNOSIS — R41844 Frontal lobe and executive function deficit: Secondary | ICD-10-CM | POA: Diagnosis not present

## 2021-01-09 DIAGNOSIS — Z87440 Personal history of urinary (tract) infections: Secondary | ICD-10-CM | POA: Diagnosis not present

## 2021-01-09 DIAGNOSIS — S066X0S Traumatic subarachnoid hemorrhage without loss of consciousness, sequela: Secondary | ICD-10-CM | POA: Diagnosis not present

## 2021-01-09 DIAGNOSIS — S065X0S Traumatic subdural hemorrhage without loss of consciousness, sequela: Secondary | ICD-10-CM | POA: Diagnosis not present

## 2021-01-09 DIAGNOSIS — G40209 Localization-related (focal) (partial) symptomatic epilepsy and epileptic syndromes with complex partial seizures, not intractable, without status epilepticus: Secondary | ICD-10-CM | POA: Diagnosis not present

## 2021-01-09 DIAGNOSIS — R1310 Dysphagia, unspecified: Secondary | ICD-10-CM | POA: Diagnosis not present

## 2021-01-11 DIAGNOSIS — G40209 Localization-related (focal) (partial) symptomatic epilepsy and epileptic syndromes with complex partial seizures, not intractable, without status epilepticus: Secondary | ICD-10-CM | POA: Diagnosis not present

## 2021-01-11 DIAGNOSIS — R41844 Frontal lobe and executive function deficit: Secondary | ICD-10-CM | POA: Diagnosis not present

## 2021-01-11 DIAGNOSIS — R1310 Dysphagia, unspecified: Secondary | ICD-10-CM | POA: Diagnosis not present

## 2021-01-11 DIAGNOSIS — Z87440 Personal history of urinary (tract) infections: Secondary | ICD-10-CM | POA: Diagnosis not present

## 2021-01-11 DIAGNOSIS — S065X0S Traumatic subdural hemorrhage without loss of consciousness, sequela: Secondary | ICD-10-CM | POA: Diagnosis not present

## 2021-01-11 DIAGNOSIS — S066X0S Traumatic subarachnoid hemorrhage without loss of consciousness, sequela: Secondary | ICD-10-CM | POA: Diagnosis not present

## 2021-01-14 ENCOUNTER — Telehealth: Payer: Self-pay | Admitting: Cardiology

## 2021-01-14 DIAGNOSIS — S065X0S Traumatic subdural hemorrhage without loss of consciousness, sequela: Secondary | ICD-10-CM | POA: Diagnosis not present

## 2021-01-14 DIAGNOSIS — Z87440 Personal history of urinary (tract) infections: Secondary | ICD-10-CM | POA: Diagnosis not present

## 2021-01-14 DIAGNOSIS — R1310 Dysphagia, unspecified: Secondary | ICD-10-CM | POA: Diagnosis not present

## 2021-01-14 DIAGNOSIS — R41844 Frontal lobe and executive function deficit: Secondary | ICD-10-CM | POA: Diagnosis not present

## 2021-01-14 DIAGNOSIS — S066X0S Traumatic subarachnoid hemorrhage without loss of consciousness, sequela: Secondary | ICD-10-CM | POA: Diagnosis not present

## 2021-01-14 DIAGNOSIS — G40209 Localization-related (focal) (partial) symptomatic epilepsy and epileptic syndromes with complex partial seizures, not intractable, without status epilepticus: Secondary | ICD-10-CM | POA: Diagnosis not present

## 2021-01-14 NOTE — Telephone Encounter (Signed)
New Message °

## 2021-01-14 NOTE — Telephone Encounter (Signed)
New Message:      Pt's wife called and wanted Dr Bettina Gavia to know pt is under Hospice Care. He will no longer be coming for any appointments. She wanted to thank Dr Bettina Gavia for everything and that she did finally have to use Hospice.Marland Kitchen

## 2021-01-16 DIAGNOSIS — S065X0S Traumatic subdural hemorrhage without loss of consciousness, sequela: Secondary | ICD-10-CM | POA: Diagnosis not present

## 2021-01-16 DIAGNOSIS — R1310 Dysphagia, unspecified: Secondary | ICD-10-CM | POA: Diagnosis not present

## 2021-01-16 DIAGNOSIS — G40209 Localization-related (focal) (partial) symptomatic epilepsy and epileptic syndromes with complex partial seizures, not intractable, without status epilepticus: Secondary | ICD-10-CM | POA: Diagnosis not present

## 2021-01-16 DIAGNOSIS — R41844 Frontal lobe and executive function deficit: Secondary | ICD-10-CM | POA: Diagnosis not present

## 2021-01-16 DIAGNOSIS — Z87440 Personal history of urinary (tract) infections: Secondary | ICD-10-CM | POA: Diagnosis not present

## 2021-01-16 DIAGNOSIS — S066X0S Traumatic subarachnoid hemorrhage without loss of consciousness, sequela: Secondary | ICD-10-CM | POA: Diagnosis not present

## 2021-01-18 DIAGNOSIS — S066X0S Traumatic subarachnoid hemorrhage without loss of consciousness, sequela: Secondary | ICD-10-CM | POA: Diagnosis not present

## 2021-01-18 DIAGNOSIS — R1310 Dysphagia, unspecified: Secondary | ICD-10-CM | POA: Diagnosis not present

## 2021-01-18 DIAGNOSIS — G40209 Localization-related (focal) (partial) symptomatic epilepsy and epileptic syndromes with complex partial seizures, not intractable, without status epilepticus: Secondary | ICD-10-CM | POA: Diagnosis not present

## 2021-01-18 DIAGNOSIS — Z87440 Personal history of urinary (tract) infections: Secondary | ICD-10-CM | POA: Diagnosis not present

## 2021-01-18 DIAGNOSIS — S065X0S Traumatic subdural hemorrhage without loss of consciousness, sequela: Secondary | ICD-10-CM | POA: Diagnosis not present

## 2021-01-18 DIAGNOSIS — R41844 Frontal lobe and executive function deficit: Secondary | ICD-10-CM | POA: Diagnosis not present

## 2021-01-20 DIAGNOSIS — S065X0S Traumatic subdural hemorrhage without loss of consciousness, sequela: Secondary | ICD-10-CM | POA: Diagnosis not present

## 2021-01-20 DIAGNOSIS — G40209 Localization-related (focal) (partial) symptomatic epilepsy and epileptic syndromes with complex partial seizures, not intractable, without status epilepticus: Secondary | ICD-10-CM | POA: Diagnosis not present

## 2021-01-20 DIAGNOSIS — Z87440 Personal history of urinary (tract) infections: Secondary | ICD-10-CM | POA: Diagnosis not present

## 2021-01-20 DIAGNOSIS — S066X0S Traumatic subarachnoid hemorrhage without loss of consciousness, sequela: Secondary | ICD-10-CM | POA: Diagnosis not present

## 2021-01-20 DIAGNOSIS — R1310 Dysphagia, unspecified: Secondary | ICD-10-CM | POA: Diagnosis not present

## 2021-01-20 DIAGNOSIS — R41844 Frontal lobe and executive function deficit: Secondary | ICD-10-CM | POA: Diagnosis not present

## 2021-01-21 DIAGNOSIS — R1310 Dysphagia, unspecified: Secondary | ICD-10-CM | POA: Diagnosis not present

## 2021-01-21 DIAGNOSIS — S065X0S Traumatic subdural hemorrhage without loss of consciousness, sequela: Secondary | ICD-10-CM | POA: Diagnosis not present

## 2021-01-21 DIAGNOSIS — Z87440 Personal history of urinary (tract) infections: Secondary | ICD-10-CM | POA: Diagnosis not present

## 2021-01-21 DIAGNOSIS — G40209 Localization-related (focal) (partial) symptomatic epilepsy and epileptic syndromes with complex partial seizures, not intractable, without status epilepticus: Secondary | ICD-10-CM | POA: Diagnosis not present

## 2021-01-21 DIAGNOSIS — S066X0S Traumatic subarachnoid hemorrhage without loss of consciousness, sequela: Secondary | ICD-10-CM | POA: Diagnosis not present

## 2021-01-21 DIAGNOSIS — R41844 Frontal lobe and executive function deficit: Secondary | ICD-10-CM | POA: Diagnosis not present

## 2021-01-23 DIAGNOSIS — R1310 Dysphagia, unspecified: Secondary | ICD-10-CM | POA: Diagnosis not present

## 2021-01-23 DIAGNOSIS — S066X0S Traumatic subarachnoid hemorrhage without loss of consciousness, sequela: Secondary | ICD-10-CM | POA: Diagnosis not present

## 2021-01-23 DIAGNOSIS — R41844 Frontal lobe and executive function deficit: Secondary | ICD-10-CM | POA: Diagnosis not present

## 2021-01-23 DIAGNOSIS — G40209 Localization-related (focal) (partial) symptomatic epilepsy and epileptic syndromes with complex partial seizures, not intractable, without status epilepticus: Secondary | ICD-10-CM | POA: Diagnosis not present

## 2021-01-23 DIAGNOSIS — Z87440 Personal history of urinary (tract) infections: Secondary | ICD-10-CM | POA: Diagnosis not present

## 2021-01-23 DIAGNOSIS — S065X0S Traumatic subdural hemorrhage without loss of consciousness, sequela: Secondary | ICD-10-CM | POA: Diagnosis not present

## 2021-01-25 DIAGNOSIS — S066X0S Traumatic subarachnoid hemorrhage without loss of consciousness, sequela: Secondary | ICD-10-CM | POA: Diagnosis not present

## 2021-01-25 DIAGNOSIS — S065X0S Traumatic subdural hemorrhage without loss of consciousness, sequela: Secondary | ICD-10-CM | POA: Diagnosis not present

## 2021-01-25 DIAGNOSIS — R41844 Frontal lobe and executive function deficit: Secondary | ICD-10-CM | POA: Diagnosis not present

## 2021-01-25 DIAGNOSIS — Z87440 Personal history of urinary (tract) infections: Secondary | ICD-10-CM | POA: Diagnosis not present

## 2021-01-25 DIAGNOSIS — G40209 Localization-related (focal) (partial) symptomatic epilepsy and epileptic syndromes with complex partial seizures, not intractable, without status epilepticus: Secondary | ICD-10-CM | POA: Diagnosis not present

## 2021-01-25 DIAGNOSIS — R1310 Dysphagia, unspecified: Secondary | ICD-10-CM | POA: Diagnosis not present

## 2021-01-28 DIAGNOSIS — Z87440 Personal history of urinary (tract) infections: Secondary | ICD-10-CM | POA: Diagnosis not present

## 2021-01-28 DIAGNOSIS — G40209 Localization-related (focal) (partial) symptomatic epilepsy and epileptic syndromes with complex partial seizures, not intractable, without status epilepticus: Secondary | ICD-10-CM | POA: Diagnosis not present

## 2021-01-28 DIAGNOSIS — R41844 Frontal lobe and executive function deficit: Secondary | ICD-10-CM | POA: Diagnosis not present

## 2021-01-28 DIAGNOSIS — S066X0S Traumatic subarachnoid hemorrhage without loss of consciousness, sequela: Secondary | ICD-10-CM | POA: Diagnosis not present

## 2021-01-28 DIAGNOSIS — S065X0S Traumatic subdural hemorrhage without loss of consciousness, sequela: Secondary | ICD-10-CM | POA: Diagnosis not present

## 2021-01-28 DIAGNOSIS — R1310 Dysphagia, unspecified: Secondary | ICD-10-CM | POA: Diagnosis not present

## 2021-01-30 ENCOUNTER — Ambulatory Visit (INDEPENDENT_AMBULATORY_CARE_PROVIDER_SITE_OTHER)

## 2021-01-30 DIAGNOSIS — Z950 Presence of cardiac pacemaker: Secondary | ICD-10-CM | POA: Diagnosis not present

## 2021-01-30 DIAGNOSIS — I10 Essential (primary) hypertension: Secondary | ICD-10-CM | POA: Diagnosis not present

## 2021-01-30 DIAGNOSIS — R001 Bradycardia, unspecified: Secondary | ICD-10-CM | POA: Diagnosis not present

## 2021-01-30 DIAGNOSIS — F0390 Unspecified dementia without behavioral disturbance: Secondary | ICD-10-CM | POA: Diagnosis not present

## 2021-01-30 DIAGNOSIS — H409 Unspecified glaucoma: Secondary | ICD-10-CM | POA: Diagnosis not present

## 2021-01-30 DIAGNOSIS — Z87440 Personal history of urinary (tract) infections: Secondary | ICD-10-CM | POA: Diagnosis not present

## 2021-01-30 DIAGNOSIS — I480 Paroxysmal atrial fibrillation: Secondary | ICD-10-CM | POA: Diagnosis not present

## 2021-01-30 DIAGNOSIS — N400 Enlarged prostate without lower urinary tract symptoms: Secondary | ICD-10-CM | POA: Diagnosis not present

## 2021-01-30 DIAGNOSIS — I495 Sick sinus syndrome: Secondary | ICD-10-CM | POA: Diagnosis not present

## 2021-01-30 DIAGNOSIS — F419 Anxiety disorder, unspecified: Secondary | ICD-10-CM | POA: Diagnosis not present

## 2021-01-30 DIAGNOSIS — M1990 Unspecified osteoarthritis, unspecified site: Secondary | ICD-10-CM | POA: Diagnosis not present

## 2021-01-30 DIAGNOSIS — S066X0S Traumatic subarachnoid hemorrhage without loss of consciousness, sequela: Secondary | ICD-10-CM | POA: Diagnosis not present

## 2021-01-30 DIAGNOSIS — R410 Disorientation, unspecified: Secondary | ICD-10-CM | POA: Diagnosis not present

## 2021-01-30 DIAGNOSIS — R634 Abnormal weight loss: Secondary | ICD-10-CM | POA: Diagnosis not present

## 2021-01-30 DIAGNOSIS — G40209 Localization-related (focal) (partial) symptomatic epilepsy and epileptic syndromes with complex partial seizures, not intractable, without status epilepticus: Secondary | ICD-10-CM | POA: Diagnosis not present

## 2021-01-30 DIAGNOSIS — J302 Other seasonal allergic rhinitis: Secondary | ICD-10-CM | POA: Diagnosis not present

## 2021-01-30 DIAGNOSIS — R41844 Frontal lobe and executive function deficit: Secondary | ICD-10-CM | POA: Diagnosis not present

## 2021-01-30 DIAGNOSIS — S065X0S Traumatic subdural hemorrhage without loss of consciousness, sequela: Secondary | ICD-10-CM | POA: Diagnosis not present

## 2021-01-30 DIAGNOSIS — R1310 Dysphagia, unspecified: Secondary | ICD-10-CM | POA: Diagnosis not present

## 2021-01-30 DIAGNOSIS — F32A Depression, unspecified: Secondary | ICD-10-CM | POA: Diagnosis not present

## 2021-01-30 DIAGNOSIS — I2510 Atherosclerotic heart disease of native coronary artery without angina pectoris: Secondary | ICD-10-CM | POA: Diagnosis not present

## 2021-01-30 LAB — CUP PACEART REMOTE DEVICE CHECK
Battery Remaining Longevity: 43 mo
Battery Voltage: 2.97 V
Brady Statistic AP VP Percent: 0.73 %
Brady Statistic AP VS Percent: 65.59 %
Brady Statistic AS VP Percent: 2.63 %
Brady Statistic AS VS Percent: 31.06 %
Brady Statistic RA Percent Paced: 66.01 %
Brady Statistic RV Percent Paced: 3.26 %
Date Time Interrogation Session: 20220601132130
Implantable Lead Implant Date: 20151029
Implantable Lead Implant Date: 20151029
Implantable Lead Location: 753859
Implantable Lead Location: 753860
Implantable Lead Model: 5076
Implantable Lead Model: 5076
Implantable Pulse Generator Implant Date: 20151029
Lead Channel Impedance Value: 323 Ohm
Lead Channel Impedance Value: 380 Ohm
Lead Channel Impedance Value: 513 Ohm
Lead Channel Impedance Value: 551 Ohm
Lead Channel Pacing Threshold Amplitude: 0.5 V
Lead Channel Pacing Threshold Amplitude: 0.625 V
Lead Channel Pacing Threshold Pulse Width: 0.4 ms
Lead Channel Pacing Threshold Pulse Width: 0.4 ms
Lead Channel Sensing Intrinsic Amplitude: 12.625 mV
Lead Channel Sensing Intrinsic Amplitude: 12.625 mV
Lead Channel Sensing Intrinsic Amplitude: 2.125 mV
Lead Channel Sensing Intrinsic Amplitude: 2.125 mV
Lead Channel Setting Pacing Amplitude: 1.5 V
Lead Channel Setting Pacing Amplitude: 2 V
Lead Channel Setting Pacing Pulse Width: 0.4 ms
Lead Channel Setting Sensing Sensitivity: 0.9 mV

## 2021-02-01 DIAGNOSIS — R1310 Dysphagia, unspecified: Secondary | ICD-10-CM | POA: Diagnosis not present

## 2021-02-01 DIAGNOSIS — S066X0S Traumatic subarachnoid hemorrhage without loss of consciousness, sequela: Secondary | ICD-10-CM | POA: Diagnosis not present

## 2021-02-01 DIAGNOSIS — S065X0S Traumatic subdural hemorrhage without loss of consciousness, sequela: Secondary | ICD-10-CM | POA: Diagnosis not present

## 2021-02-01 DIAGNOSIS — R41844 Frontal lobe and executive function deficit: Secondary | ICD-10-CM | POA: Diagnosis not present

## 2021-02-01 DIAGNOSIS — G40209 Localization-related (focal) (partial) symptomatic epilepsy and epileptic syndromes with complex partial seizures, not intractable, without status epilepticus: Secondary | ICD-10-CM | POA: Diagnosis not present

## 2021-02-01 DIAGNOSIS — Z87440 Personal history of urinary (tract) infections: Secondary | ICD-10-CM | POA: Diagnosis not present

## 2021-02-04 ENCOUNTER — Ambulatory Visit: Payer: Medicare Other | Admitting: Cardiology

## 2021-02-04 DIAGNOSIS — S065X0S Traumatic subdural hemorrhage without loss of consciousness, sequela: Secondary | ICD-10-CM | POA: Diagnosis not present

## 2021-02-04 DIAGNOSIS — R41844 Frontal lobe and executive function deficit: Secondary | ICD-10-CM | POA: Diagnosis not present

## 2021-02-04 DIAGNOSIS — R1310 Dysphagia, unspecified: Secondary | ICD-10-CM | POA: Diagnosis not present

## 2021-02-04 DIAGNOSIS — Z87440 Personal history of urinary (tract) infections: Secondary | ICD-10-CM | POA: Diagnosis not present

## 2021-02-04 DIAGNOSIS — G40209 Localization-related (focal) (partial) symptomatic epilepsy and epileptic syndromes with complex partial seizures, not intractable, without status epilepticus: Secondary | ICD-10-CM | POA: Diagnosis not present

## 2021-02-04 DIAGNOSIS — S066X0S Traumatic subarachnoid hemorrhage without loss of consciousness, sequela: Secondary | ICD-10-CM | POA: Diagnosis not present

## 2021-02-06 DIAGNOSIS — S065X0S Traumatic subdural hemorrhage without loss of consciousness, sequela: Secondary | ICD-10-CM | POA: Diagnosis not present

## 2021-02-06 DIAGNOSIS — Z87440 Personal history of urinary (tract) infections: Secondary | ICD-10-CM | POA: Diagnosis not present

## 2021-02-06 DIAGNOSIS — S066X0S Traumatic subarachnoid hemorrhage without loss of consciousness, sequela: Secondary | ICD-10-CM | POA: Diagnosis not present

## 2021-02-06 DIAGNOSIS — R1310 Dysphagia, unspecified: Secondary | ICD-10-CM | POA: Diagnosis not present

## 2021-02-06 DIAGNOSIS — G40209 Localization-related (focal) (partial) symptomatic epilepsy and epileptic syndromes with complex partial seizures, not intractable, without status epilepticus: Secondary | ICD-10-CM | POA: Diagnosis not present

## 2021-02-06 DIAGNOSIS — R41844 Frontal lobe and executive function deficit: Secondary | ICD-10-CM | POA: Diagnosis not present

## 2021-02-07 DIAGNOSIS — G40209 Localization-related (focal) (partial) symptomatic epilepsy and epileptic syndromes with complex partial seizures, not intractable, without status epilepticus: Secondary | ICD-10-CM | POA: Diagnosis not present

## 2021-02-07 DIAGNOSIS — S065X0S Traumatic subdural hemorrhage without loss of consciousness, sequela: Secondary | ICD-10-CM | POA: Diagnosis not present

## 2021-02-07 DIAGNOSIS — Z87440 Personal history of urinary (tract) infections: Secondary | ICD-10-CM | POA: Diagnosis not present

## 2021-02-07 DIAGNOSIS — R1310 Dysphagia, unspecified: Secondary | ICD-10-CM | POA: Diagnosis not present

## 2021-02-07 DIAGNOSIS — S066X0S Traumatic subarachnoid hemorrhage without loss of consciousness, sequela: Secondary | ICD-10-CM | POA: Diagnosis not present

## 2021-02-07 DIAGNOSIS — R41844 Frontal lobe and executive function deficit: Secondary | ICD-10-CM | POA: Diagnosis not present

## 2021-02-08 DIAGNOSIS — S066X0S Traumatic subarachnoid hemorrhage without loss of consciousness, sequela: Secondary | ICD-10-CM | POA: Diagnosis not present

## 2021-02-08 DIAGNOSIS — R1310 Dysphagia, unspecified: Secondary | ICD-10-CM | POA: Diagnosis not present

## 2021-02-08 DIAGNOSIS — Z87440 Personal history of urinary (tract) infections: Secondary | ICD-10-CM | POA: Diagnosis not present

## 2021-02-08 DIAGNOSIS — R41844 Frontal lobe and executive function deficit: Secondary | ICD-10-CM | POA: Diagnosis not present

## 2021-02-08 DIAGNOSIS — S065X0S Traumatic subdural hemorrhage without loss of consciousness, sequela: Secondary | ICD-10-CM | POA: Diagnosis not present

## 2021-02-08 DIAGNOSIS — G40209 Localization-related (focal) (partial) symptomatic epilepsy and epileptic syndromes with complex partial seizures, not intractable, without status epilepticus: Secondary | ICD-10-CM | POA: Diagnosis not present

## 2021-02-09 DIAGNOSIS — R1310 Dysphagia, unspecified: Secondary | ICD-10-CM | POA: Diagnosis not present

## 2021-02-09 DIAGNOSIS — Z87440 Personal history of urinary (tract) infections: Secondary | ICD-10-CM | POA: Diagnosis not present

## 2021-02-09 DIAGNOSIS — S065X0S Traumatic subdural hemorrhage without loss of consciousness, sequela: Secondary | ICD-10-CM | POA: Diagnosis not present

## 2021-02-09 DIAGNOSIS — S066X0S Traumatic subarachnoid hemorrhage without loss of consciousness, sequela: Secondary | ICD-10-CM | POA: Diagnosis not present

## 2021-02-09 DIAGNOSIS — G40209 Localization-related (focal) (partial) symptomatic epilepsy and epileptic syndromes with complex partial seizures, not intractable, without status epilepticus: Secondary | ICD-10-CM | POA: Diagnosis not present

## 2021-02-09 DIAGNOSIS — R41844 Frontal lobe and executive function deficit: Secondary | ICD-10-CM | POA: Diagnosis not present

## 2021-02-11 DIAGNOSIS — I495 Sick sinus syndrome: Secondary | ICD-10-CM | POA: Diagnosis not present

## 2021-02-11 DIAGNOSIS — G40209 Localization-related (focal) (partial) symptomatic epilepsy and epileptic syndromes with complex partial seizures, not intractable, without status epilepticus: Secondary | ICD-10-CM | POA: Diagnosis not present

## 2021-02-11 DIAGNOSIS — I10 Essential (primary) hypertension: Secondary | ICD-10-CM | POA: Diagnosis not present

## 2021-02-11 DIAGNOSIS — E559 Vitamin D deficiency, unspecified: Secondary | ICD-10-CM | POA: Diagnosis not present

## 2021-02-11 DIAGNOSIS — Z6824 Body mass index (BMI) 24.0-24.9, adult: Secondary | ICD-10-CM | POA: Diagnosis not present

## 2021-02-11 DIAGNOSIS — J309 Allergic rhinitis, unspecified: Secondary | ICD-10-CM | POA: Diagnosis not present

## 2021-02-11 DIAGNOSIS — R41844 Frontal lobe and executive function deficit: Secondary | ICD-10-CM | POA: Diagnosis not present

## 2021-02-11 DIAGNOSIS — G40909 Epilepsy, unspecified, not intractable, without status epilepticus: Secondary | ICD-10-CM | POA: Diagnosis not present

## 2021-02-11 DIAGNOSIS — R413 Other amnesia: Secondary | ICD-10-CM | POA: Diagnosis not present

## 2021-02-11 DIAGNOSIS — S065X0S Traumatic subdural hemorrhage without loss of consciousness, sequela: Secondary | ICD-10-CM | POA: Diagnosis not present

## 2021-02-11 DIAGNOSIS — I4892 Unspecified atrial flutter: Secondary | ICD-10-CM | POA: Diagnosis not present

## 2021-02-11 DIAGNOSIS — E785 Hyperlipidemia, unspecified: Secondary | ICD-10-CM | POA: Diagnosis not present

## 2021-02-11 DIAGNOSIS — S066X0S Traumatic subarachnoid hemorrhage without loss of consciousness, sequela: Secondary | ICD-10-CM | POA: Diagnosis not present

## 2021-02-11 DIAGNOSIS — G250 Essential tremor: Secondary | ICD-10-CM | POA: Diagnosis not present

## 2021-02-11 DIAGNOSIS — Z87440 Personal history of urinary (tract) infections: Secondary | ICD-10-CM | POA: Diagnosis not present

## 2021-02-11 DIAGNOSIS — R1310 Dysphagia, unspecified: Secondary | ICD-10-CM | POA: Diagnosis not present

## 2021-02-11 DIAGNOSIS — F418 Other specified anxiety disorders: Secondary | ICD-10-CM | POA: Diagnosis not present

## 2021-02-11 DIAGNOSIS — N390 Urinary tract infection, site not specified: Secondary | ICD-10-CM | POA: Diagnosis not present

## 2021-02-13 DIAGNOSIS — S065X0S Traumatic subdural hemorrhage without loss of consciousness, sequela: Secondary | ICD-10-CM | POA: Diagnosis not present

## 2021-02-13 DIAGNOSIS — Z87440 Personal history of urinary (tract) infections: Secondary | ICD-10-CM | POA: Diagnosis not present

## 2021-02-13 DIAGNOSIS — R1310 Dysphagia, unspecified: Secondary | ICD-10-CM | POA: Diagnosis not present

## 2021-02-13 DIAGNOSIS — G40209 Localization-related (focal) (partial) symptomatic epilepsy and epileptic syndromes with complex partial seizures, not intractable, without status epilepticus: Secondary | ICD-10-CM | POA: Diagnosis not present

## 2021-02-13 DIAGNOSIS — R41844 Frontal lobe and executive function deficit: Secondary | ICD-10-CM | POA: Diagnosis not present

## 2021-02-13 DIAGNOSIS — S066X0S Traumatic subarachnoid hemorrhage without loss of consciousness, sequela: Secondary | ICD-10-CM | POA: Diagnosis not present

## 2021-02-15 DIAGNOSIS — S066X0S Traumatic subarachnoid hemorrhage without loss of consciousness, sequela: Secondary | ICD-10-CM | POA: Diagnosis not present

## 2021-02-15 DIAGNOSIS — G40209 Localization-related (focal) (partial) symptomatic epilepsy and epileptic syndromes with complex partial seizures, not intractable, without status epilepticus: Secondary | ICD-10-CM | POA: Diagnosis not present

## 2021-02-15 DIAGNOSIS — R1310 Dysphagia, unspecified: Secondary | ICD-10-CM | POA: Diagnosis not present

## 2021-02-15 DIAGNOSIS — Z87440 Personal history of urinary (tract) infections: Secondary | ICD-10-CM | POA: Diagnosis not present

## 2021-02-15 DIAGNOSIS — R41844 Frontal lobe and executive function deficit: Secondary | ICD-10-CM | POA: Diagnosis not present

## 2021-02-15 DIAGNOSIS — S065X0S Traumatic subdural hemorrhage without loss of consciousness, sequela: Secondary | ICD-10-CM | POA: Diagnosis not present

## 2021-02-18 DIAGNOSIS — S066X0S Traumatic subarachnoid hemorrhage without loss of consciousness, sequela: Secondary | ICD-10-CM | POA: Diagnosis not present

## 2021-02-18 DIAGNOSIS — Z87440 Personal history of urinary (tract) infections: Secondary | ICD-10-CM | POA: Diagnosis not present

## 2021-02-18 DIAGNOSIS — R41844 Frontal lobe and executive function deficit: Secondary | ICD-10-CM | POA: Diagnosis not present

## 2021-02-18 DIAGNOSIS — G40209 Localization-related (focal) (partial) symptomatic epilepsy and epileptic syndromes with complex partial seizures, not intractable, without status epilepticus: Secondary | ICD-10-CM | POA: Diagnosis not present

## 2021-02-18 DIAGNOSIS — S065X0S Traumatic subdural hemorrhage without loss of consciousness, sequela: Secondary | ICD-10-CM | POA: Diagnosis not present

## 2021-02-18 DIAGNOSIS — R1310 Dysphagia, unspecified: Secondary | ICD-10-CM | POA: Diagnosis not present

## 2021-02-20 DIAGNOSIS — S065X0S Traumatic subdural hemorrhage without loss of consciousness, sequela: Secondary | ICD-10-CM | POA: Diagnosis not present

## 2021-02-20 DIAGNOSIS — G40209 Localization-related (focal) (partial) symptomatic epilepsy and epileptic syndromes with complex partial seizures, not intractable, without status epilepticus: Secondary | ICD-10-CM | POA: Diagnosis not present

## 2021-02-20 DIAGNOSIS — R1310 Dysphagia, unspecified: Secondary | ICD-10-CM | POA: Diagnosis not present

## 2021-02-20 DIAGNOSIS — Z87440 Personal history of urinary (tract) infections: Secondary | ICD-10-CM | POA: Diagnosis not present

## 2021-02-20 DIAGNOSIS — R41844 Frontal lobe and executive function deficit: Secondary | ICD-10-CM | POA: Diagnosis not present

## 2021-02-20 DIAGNOSIS — S066X0S Traumatic subarachnoid hemorrhage without loss of consciousness, sequela: Secondary | ICD-10-CM | POA: Diagnosis not present

## 2021-02-21 ENCOUNTER — Telehealth: Payer: Self-pay | Admitting: Cardiology

## 2021-02-21 DIAGNOSIS — S066X0S Traumatic subarachnoid hemorrhage without loss of consciousness, sequela: Secondary | ICD-10-CM | POA: Diagnosis not present

## 2021-02-21 DIAGNOSIS — R1310 Dysphagia, unspecified: Secondary | ICD-10-CM | POA: Diagnosis not present

## 2021-02-21 DIAGNOSIS — S065X0S Traumatic subdural hemorrhage without loss of consciousness, sequela: Secondary | ICD-10-CM | POA: Diagnosis not present

## 2021-02-21 DIAGNOSIS — Z87440 Personal history of urinary (tract) infections: Secondary | ICD-10-CM | POA: Diagnosis not present

## 2021-02-21 DIAGNOSIS — G40209 Localization-related (focal) (partial) symptomatic epilepsy and epileptic syndromes with complex partial seizures, not intractable, without status epilepticus: Secondary | ICD-10-CM | POA: Diagnosis not present

## 2021-02-21 DIAGNOSIS — R41844 Frontal lobe and executive function deficit: Secondary | ICD-10-CM | POA: Diagnosis not present

## 2021-02-21 NOTE — Telephone Encounter (Signed)
Follow Up:    Pt's wife says she is returning your call.

## 2021-02-21 NOTE — Telephone Encounter (Signed)
See 6/1 Claudia Desanctis art report for further details

## 2021-02-22 DIAGNOSIS — S066X0S Traumatic subarachnoid hemorrhage without loss of consciousness, sequela: Secondary | ICD-10-CM | POA: Diagnosis not present

## 2021-02-22 DIAGNOSIS — R41844 Frontal lobe and executive function deficit: Secondary | ICD-10-CM | POA: Diagnosis not present

## 2021-02-22 DIAGNOSIS — S065X0S Traumatic subdural hemorrhage without loss of consciousness, sequela: Secondary | ICD-10-CM | POA: Diagnosis not present

## 2021-02-22 DIAGNOSIS — R1310 Dysphagia, unspecified: Secondary | ICD-10-CM | POA: Diagnosis not present

## 2021-02-22 DIAGNOSIS — Z87440 Personal history of urinary (tract) infections: Secondary | ICD-10-CM | POA: Diagnosis not present

## 2021-02-22 DIAGNOSIS — G40209 Localization-related (focal) (partial) symptomatic epilepsy and epileptic syndromes with complex partial seizures, not intractable, without status epilepticus: Secondary | ICD-10-CM | POA: Diagnosis not present

## 2021-02-22 NOTE — Progress Notes (Signed)
Remote pacemaker transmission.   

## 2021-02-23 DIAGNOSIS — R41844 Frontal lobe and executive function deficit: Secondary | ICD-10-CM | POA: Diagnosis not present

## 2021-02-23 DIAGNOSIS — G40209 Localization-related (focal) (partial) symptomatic epilepsy and epileptic syndromes with complex partial seizures, not intractable, without status epilepticus: Secondary | ICD-10-CM | POA: Diagnosis not present

## 2021-02-23 DIAGNOSIS — S065X0S Traumatic subdural hemorrhage without loss of consciousness, sequela: Secondary | ICD-10-CM | POA: Diagnosis not present

## 2021-02-23 DIAGNOSIS — S066X0S Traumatic subarachnoid hemorrhage without loss of consciousness, sequela: Secondary | ICD-10-CM | POA: Diagnosis not present

## 2021-02-23 DIAGNOSIS — Z87440 Personal history of urinary (tract) infections: Secondary | ICD-10-CM | POA: Diagnosis not present

## 2021-02-23 DIAGNOSIS — R1310 Dysphagia, unspecified: Secondary | ICD-10-CM | POA: Diagnosis not present

## 2021-02-24 DIAGNOSIS — Z87440 Personal history of urinary (tract) infections: Secondary | ICD-10-CM | POA: Diagnosis not present

## 2021-02-24 DIAGNOSIS — S065X0S Traumatic subdural hemorrhage without loss of consciousness, sequela: Secondary | ICD-10-CM | POA: Diagnosis not present

## 2021-02-24 DIAGNOSIS — R41844 Frontal lobe and executive function deficit: Secondary | ICD-10-CM | POA: Diagnosis not present

## 2021-02-24 DIAGNOSIS — G40209 Localization-related (focal) (partial) symptomatic epilepsy and epileptic syndromes with complex partial seizures, not intractable, without status epilepticus: Secondary | ICD-10-CM | POA: Diagnosis not present

## 2021-02-24 DIAGNOSIS — S066X0S Traumatic subarachnoid hemorrhage without loss of consciousness, sequela: Secondary | ICD-10-CM | POA: Diagnosis not present

## 2021-02-24 DIAGNOSIS — R1310 Dysphagia, unspecified: Secondary | ICD-10-CM | POA: Diagnosis not present

## 2021-02-25 DIAGNOSIS — R1310 Dysphagia, unspecified: Secondary | ICD-10-CM | POA: Diagnosis not present

## 2021-02-25 DIAGNOSIS — S066X0S Traumatic subarachnoid hemorrhage without loss of consciousness, sequela: Secondary | ICD-10-CM | POA: Diagnosis not present

## 2021-02-25 DIAGNOSIS — Z87440 Personal history of urinary (tract) infections: Secondary | ICD-10-CM | POA: Diagnosis not present

## 2021-02-25 DIAGNOSIS — G40209 Localization-related (focal) (partial) symptomatic epilepsy and epileptic syndromes with complex partial seizures, not intractable, without status epilepticus: Secondary | ICD-10-CM | POA: Diagnosis not present

## 2021-02-25 DIAGNOSIS — R41844 Frontal lobe and executive function deficit: Secondary | ICD-10-CM | POA: Diagnosis not present

## 2021-02-25 DIAGNOSIS — S065X0S Traumatic subdural hemorrhage without loss of consciousness, sequela: Secondary | ICD-10-CM | POA: Diagnosis not present

## 2021-02-26 DIAGNOSIS — S065X0S Traumatic subdural hemorrhage without loss of consciousness, sequela: Secondary | ICD-10-CM | POA: Diagnosis not present

## 2021-02-26 DIAGNOSIS — G40209 Localization-related (focal) (partial) symptomatic epilepsy and epileptic syndromes with complex partial seizures, not intractable, without status epilepticus: Secondary | ICD-10-CM | POA: Diagnosis not present

## 2021-02-26 DIAGNOSIS — R1310 Dysphagia, unspecified: Secondary | ICD-10-CM | POA: Diagnosis not present

## 2021-02-26 DIAGNOSIS — R41844 Frontal lobe and executive function deficit: Secondary | ICD-10-CM | POA: Diagnosis not present

## 2021-02-26 DIAGNOSIS — S066X0S Traumatic subarachnoid hemorrhage without loss of consciousness, sequela: Secondary | ICD-10-CM | POA: Diagnosis not present

## 2021-02-26 DIAGNOSIS — Z87440 Personal history of urinary (tract) infections: Secondary | ICD-10-CM | POA: Diagnosis not present

## 2021-02-27 DIAGNOSIS — S065X0S Traumatic subdural hemorrhage without loss of consciousness, sequela: Secondary | ICD-10-CM | POA: Diagnosis not present

## 2021-02-27 DIAGNOSIS — G40209 Localization-related (focal) (partial) symptomatic epilepsy and epileptic syndromes with complex partial seizures, not intractable, without status epilepticus: Secondary | ICD-10-CM | POA: Diagnosis not present

## 2021-02-27 DIAGNOSIS — S066X0S Traumatic subarachnoid hemorrhage without loss of consciousness, sequela: Secondary | ICD-10-CM | POA: Diagnosis not present

## 2021-02-27 DIAGNOSIS — R41844 Frontal lobe and executive function deficit: Secondary | ICD-10-CM | POA: Diagnosis not present

## 2021-02-27 DIAGNOSIS — R1310 Dysphagia, unspecified: Secondary | ICD-10-CM | POA: Diagnosis not present

## 2021-02-27 DIAGNOSIS — Z87440 Personal history of urinary (tract) infections: Secondary | ICD-10-CM | POA: Diagnosis not present

## 2021-02-28 DIAGNOSIS — G40209 Localization-related (focal) (partial) symptomatic epilepsy and epileptic syndromes with complex partial seizures, not intractable, without status epilepticus: Secondary | ICD-10-CM | POA: Diagnosis not present

## 2021-02-28 DIAGNOSIS — S065X0S Traumatic subdural hemorrhage without loss of consciousness, sequela: Secondary | ICD-10-CM | POA: Diagnosis not present

## 2021-02-28 DIAGNOSIS — S066X0S Traumatic subarachnoid hemorrhage without loss of consciousness, sequela: Secondary | ICD-10-CM | POA: Diagnosis not present

## 2021-02-28 DIAGNOSIS — R41844 Frontal lobe and executive function deficit: Secondary | ICD-10-CM | POA: Diagnosis not present

## 2021-02-28 DIAGNOSIS — Z87440 Personal history of urinary (tract) infections: Secondary | ICD-10-CM | POA: Diagnosis not present

## 2021-02-28 DIAGNOSIS — R1310 Dysphagia, unspecified: Secondary | ICD-10-CM | POA: Diagnosis not present

## 2021-03-01 DIAGNOSIS — R1310 Dysphagia, unspecified: Secondary | ICD-10-CM | POA: Diagnosis not present

## 2021-03-01 DIAGNOSIS — I10 Essential (primary) hypertension: Secondary | ICD-10-CM | POA: Diagnosis not present

## 2021-03-01 DIAGNOSIS — R410 Disorientation, unspecified: Secondary | ICD-10-CM | POA: Diagnosis not present

## 2021-03-01 DIAGNOSIS — F32A Depression, unspecified: Secondary | ICD-10-CM | POA: Diagnosis not present

## 2021-03-01 DIAGNOSIS — M1990 Unspecified osteoarthritis, unspecified site: Secondary | ICD-10-CM | POA: Diagnosis not present

## 2021-03-01 DIAGNOSIS — R001 Bradycardia, unspecified: Secondary | ICD-10-CM | POA: Diagnosis not present

## 2021-03-01 DIAGNOSIS — R634 Abnormal weight loss: Secondary | ICD-10-CM | POA: Diagnosis not present

## 2021-03-01 DIAGNOSIS — F419 Anxiety disorder, unspecified: Secondary | ICD-10-CM | POA: Diagnosis not present

## 2021-03-01 DIAGNOSIS — I480 Paroxysmal atrial fibrillation: Secondary | ICD-10-CM | POA: Diagnosis not present

## 2021-03-01 DIAGNOSIS — Z950 Presence of cardiac pacemaker: Secondary | ICD-10-CM | POA: Diagnosis not present

## 2021-03-01 DIAGNOSIS — Z87440 Personal history of urinary (tract) infections: Secondary | ICD-10-CM | POA: Diagnosis not present

## 2021-03-01 DIAGNOSIS — J302 Other seasonal allergic rhinitis: Secondary | ICD-10-CM | POA: Diagnosis not present

## 2021-03-01 DIAGNOSIS — S065X0S Traumatic subdural hemorrhage without loss of consciousness, sequela: Secondary | ICD-10-CM | POA: Diagnosis not present

## 2021-03-01 DIAGNOSIS — F0390 Unspecified dementia without behavioral disturbance: Secondary | ICD-10-CM | POA: Diagnosis not present

## 2021-03-01 DIAGNOSIS — N400 Enlarged prostate without lower urinary tract symptoms: Secondary | ICD-10-CM | POA: Diagnosis not present

## 2021-03-01 DIAGNOSIS — H409 Unspecified glaucoma: Secondary | ICD-10-CM | POA: Diagnosis not present

## 2021-03-01 DIAGNOSIS — I2510 Atherosclerotic heart disease of native coronary artery without angina pectoris: Secondary | ICD-10-CM | POA: Diagnosis not present

## 2021-03-01 DIAGNOSIS — G40209 Localization-related (focal) (partial) symptomatic epilepsy and epileptic syndromes with complex partial seizures, not intractable, without status epilepticus: Secondary | ICD-10-CM | POA: Diagnosis not present

## 2021-03-01 DIAGNOSIS — R41844 Frontal lobe and executive function deficit: Secondary | ICD-10-CM | POA: Diagnosis not present

## 2021-03-01 DIAGNOSIS — S066X0S Traumatic subarachnoid hemorrhage without loss of consciousness, sequela: Secondary | ICD-10-CM | POA: Diagnosis not present

## 2021-03-02 DIAGNOSIS — S065X0S Traumatic subdural hemorrhage without loss of consciousness, sequela: Secondary | ICD-10-CM | POA: Diagnosis not present

## 2021-03-02 DIAGNOSIS — R1310 Dysphagia, unspecified: Secondary | ICD-10-CM | POA: Diagnosis not present

## 2021-03-02 DIAGNOSIS — Z87440 Personal history of urinary (tract) infections: Secondary | ICD-10-CM | POA: Diagnosis not present

## 2021-03-02 DIAGNOSIS — G40209 Localization-related (focal) (partial) symptomatic epilepsy and epileptic syndromes with complex partial seizures, not intractable, without status epilepticus: Secondary | ICD-10-CM | POA: Diagnosis not present

## 2021-03-02 DIAGNOSIS — S066X0S Traumatic subarachnoid hemorrhage without loss of consciousness, sequela: Secondary | ICD-10-CM | POA: Diagnosis not present

## 2021-03-02 DIAGNOSIS — R41844 Frontal lobe and executive function deficit: Secondary | ICD-10-CM | POA: Diagnosis not present

## 2021-03-03 DIAGNOSIS — S066X0S Traumatic subarachnoid hemorrhage without loss of consciousness, sequela: Secondary | ICD-10-CM | POA: Diagnosis not present

## 2021-03-03 DIAGNOSIS — S065X0S Traumatic subdural hemorrhage without loss of consciousness, sequela: Secondary | ICD-10-CM | POA: Diagnosis not present

## 2021-03-03 DIAGNOSIS — Z87440 Personal history of urinary (tract) infections: Secondary | ICD-10-CM | POA: Diagnosis not present

## 2021-03-03 DIAGNOSIS — R41844 Frontal lobe and executive function deficit: Secondary | ICD-10-CM | POA: Diagnosis not present

## 2021-03-03 DIAGNOSIS — R1310 Dysphagia, unspecified: Secondary | ICD-10-CM | POA: Diagnosis not present

## 2021-03-03 DIAGNOSIS — G40209 Localization-related (focal) (partial) symptomatic epilepsy and epileptic syndromes with complex partial seizures, not intractable, without status epilepticus: Secondary | ICD-10-CM | POA: Diagnosis not present

## 2021-04-01 DEATH — deceased

## 2021-04-30 ENCOUNTER — Telehealth: Payer: Self-pay

## 2021-04-30 NOTE — Telephone Encounter (Signed)
Pt wife states the patient passed away 2021-03-06. I cancelled all upcoming remotes and took him out of [facility]. I ordered the patient a return kit and his wife should receive it in 7-10 business days.
